# Patient Record
Sex: Female | Born: 1960 | Race: White | Hispanic: No | Smoking: Former smoker
Health system: Southern US, Community
[De-identification: ages and names within clinical notes are randomized; demographics above are authoritative.]

## PROBLEM LIST (undated history)

## (undated) DIAGNOSIS — M329 Systemic lupus erythematosus, unspecified: Secondary | ICD-10-CM

## (undated) DIAGNOSIS — Z8489 Family history of other specified conditions: Secondary | ICD-10-CM

## (undated) DIAGNOSIS — F419 Anxiety disorder, unspecified: Secondary | ICD-10-CM

## (undated) DIAGNOSIS — IMO0002 Reserved for concepts with insufficient information to code with codable children: Secondary | ICD-10-CM

## (undated) DIAGNOSIS — M8580 Other specified disorders of bone density and structure, unspecified site: Secondary | ICD-10-CM

## (undated) DIAGNOSIS — E559 Vitamin D deficiency, unspecified: Secondary | ICD-10-CM

## (undated) DIAGNOSIS — C801 Malignant (primary) neoplasm, unspecified: Secondary | ICD-10-CM

## (undated) DIAGNOSIS — M1990 Unspecified osteoarthritis, unspecified site: Secondary | ICD-10-CM

## (undated) DIAGNOSIS — F32A Depression, unspecified: Secondary | ICD-10-CM

## (undated) HISTORY — PX: NASAL POLYP SURGERY: SHX186

## (undated) HISTORY — DX: Depression, unspecified: F32.A

## (undated) HISTORY — DX: Vitamin D deficiency, unspecified: E55.9

## (undated) HISTORY — PX: HAND SURGERY: SHX662

## (undated) HISTORY — PX: PANCREAS SURGERY: SHX731

## (undated) HISTORY — PX: ABDOMINAL HYSTERECTOMY: SHX81

## (undated) HISTORY — PX: TOTAL ABDOMINAL HYSTERECTOMY W/ BILATERAL SALPINGOOPHORECTOMY: SHX83

## (undated) HISTORY — PX: CHOLECYSTECTOMY: SHX55

## (undated) HISTORY — DX: Other specified disorders of bone density and structure, unspecified site: M85.80

---

## 2003-10-15 DIAGNOSIS — Z9049 Acquired absence of other specified parts of digestive tract: Secondary | ICD-10-CM | POA: Insufficient documentation

## 2006-03-17 ENCOUNTER — Ambulatory Visit: Payer: Self-pay | Admitting: Internal Medicine

## 2007-01-02 ENCOUNTER — Ambulatory Visit: Payer: Self-pay | Admitting: Unknown Physician Specialty

## 2007-08-15 ENCOUNTER — Ambulatory Visit: Payer: Self-pay | Admitting: Unknown Physician Specialty

## 2009-01-16 ENCOUNTER — Ambulatory Visit: Payer: Self-pay | Admitting: Unknown Physician Specialty

## 2009-05-28 ENCOUNTER — Ambulatory Visit: Payer: Self-pay | Admitting: Gynecologic Oncology

## 2009-06-10 ENCOUNTER — Ambulatory Visit: Payer: Self-pay | Admitting: Gynecologic Oncology

## 2009-06-27 ENCOUNTER — Ambulatory Visit: Payer: Self-pay | Admitting: Gynecologic Oncology

## 2009-09-23 ENCOUNTER — Emergency Department: Payer: Self-pay | Admitting: Emergency Medicine

## 2009-12-25 ENCOUNTER — Ambulatory Visit: Payer: Self-pay | Admitting: Otolaryngology

## 2010-09-10 ENCOUNTER — Ambulatory Visit: Payer: Self-pay | Admitting: Unknown Physician Specialty

## 2011-11-19 ENCOUNTER — Ambulatory Visit: Payer: Self-pay | Admitting: Unknown Physician Specialty

## 2013-09-27 DIAGNOSIS — Z932 Ileostomy status: Secondary | ICD-10-CM

## 2013-09-27 HISTORY — DX: Ileostomy status: Z93.2

## 2014-07-23 ENCOUNTER — Other Ambulatory Visit: Payer: Self-pay | Admitting: Physician Assistant

## 2014-07-23 NOTE — H&P (Signed)
TOTAL KNEE ADMISSION H&P  Patient is being admitted for right total knee arthroplasty.  Subjective:  Chief Complaint:right knee pain.  HPI: Hannah Hurst, 53 y.o. female, has a history of pain and functional disability in the right knee due to arthritis and has failed non-surgical conservative treatments for greater than 12 weeks to includeNSAID's and/or analgesics, corticosteriod injections, viscosupplementation injections and activity modification.  Onset of symptoms was gradual, starting >10 years ago with gradually worsening course since that time. The patient noted no past surgery on the right knee(s).  Patient currently rates pain in the right knee(s) at 4 out of 10 with activity. Patient has night pain, worsening of pain with activity and weight bearing, crepitus and joint swelling.  Patient has evidence of subchondral cysts, subchondral sclerosis, periarticular osteophytes and joint space narrowing by imaging studies. There is no active infection.  There are no active problems to display for this patient.  No past medical history on file.  No past surgical history on file.   (Not in a hospital admission) Allergies not on file  History  Substance Use Topics  . Smoking status: Not on file  . Smokeless tobacco: Not on file  . Alcohol Use: Not on file    No family history on file.   Review of Systems  Constitutional: Negative.   HENT: Negative.   Eyes: Negative.   Respiratory: Negative.   Cardiovascular: Negative.   Gastrointestinal: Negative.   Genitourinary: Negative.   Musculoskeletal: Positive for joint pain.  Skin: Negative.   Neurological: Negative.   Endo/Heme/Allergies: Negative.   Psychiatric/Behavioral: Negative.     Objective:  Physical Exam  Constitutional: She is oriented to person, place, and time. She appears well-developed and well-nourished.  HENT:  Head: Normocephalic and atraumatic.  Eyes: EOM are normal. Pupils are equal, round, and reactive to  light.  Neck: Normal range of motion. Neck supple.  Cardiovascular: Normal rate, regular rhythm and normal heart sounds.  Exam reveals no gallop and no friction rub.   No murmur heard. Respiratory: Effort normal and breath sounds normal.  GI: Soft. Bowel sounds are normal.  Musculoskeletal:  On the right she has a 2-3+ effusion.  3+ patellofemoral crepitus.  Her alignment is not bad, but there are marked degenerative changes in the patellofemoral joint.  I am not getting a lot of tenderness medial or lateral compartment.  Her ligaments are otherwise stable.  Neurovascularly intact distally.  Neurological: She is alert and oriented to person, place, and time.  Skin: Skin is warm and dry.  Psychiatric: She has a normal mood and affect. Her behavior is normal. Judgment and thought content normal.    Vital signs in last 24 hours: @VSRANGES @  Labs:   There is no height or weight on file to calculate BMI.   Imaging Review Plain radiographs demonstrate severe degenerative joint disease of the right knee(s). The overall alignment isneutral. The bone quality appears to be fair for age and reported activity level.  Assessment/Plan:  End stage arthritis, right knee   The patient history, physical examination, clinical judgment of the provider and imaging studies are consistent with end stage degenerative joint disease of the right knee(s) and total knee arthroplasty is deemed medically necessary. The treatment options including medical management, injection therapy arthroscopy and arthroplasty were discussed at length. The risks and benefits of total knee arthroplasty were presented and reviewed. The risks due to aseptic loosening, infection, stiffness, patella tracking problems, thromboembolic complications and other imponderables were discussed. The  patient acknowledged the explanation, agreed to proceed with the plan and consent was signed. Patient is being admitted for inpatient treatment for  surgery, pain control, PT, OT, prophylactic antibiotics, VTE prophylaxis, progressive ambulation and ADL's and discharge planning. The patient is planning to be discharged home with home health services

## 2014-07-25 ENCOUNTER — Encounter (HOSPITAL_COMMUNITY): Payer: Self-pay | Admitting: Pharmacy Technician

## 2014-07-29 ENCOUNTER — Other Ambulatory Visit (HOSPITAL_COMMUNITY): Payer: Self-pay | Admitting: *Deleted

## 2014-07-29 NOTE — Pre-Procedure Instructions (Signed)
Hannah Hurst  07/29/2014   Your procedure is scheduled on:  Wednesday, August 07, 2014 at 1:20 PM.   Report to [facility] Entrance "A" Admitting Office at 11:20 AM.   Call this number if you have problems the morning of surgery: [phone]   Remember:   Do not eat food or drink liquids after midnight.   Take these medicines the morning of surgery with A SIP OF WATER:  Tramadol - if needed   Do not wear jewelry, make-up or nail polish.  Do not wear lotions, powders, or perfumes. You may wear deodorant.  Do not shave 48 hours prior to surgery.  Do not bring valuables to the hospital.  [facility] is not responsible                  for any belongings or valuables.               Contacts, dentures or bridgework may not be worn into surgery.  Leave suitcase in the car. After surgery it may be brought to your room.  For patients admitted to the hospital, discharge time is determined by your                treatment team.              Special Instructions: [facility] - Preparing for Surgery  Before surgery, you can play an important role.  Because skin is not sterile, your skin needs to be as free of germs as possible.  You can reduce the number of germs on you skin by washing with CHG (chlorahexidine gluconate) soap before surgery.  CHG is an antiseptic cleaner which kills germs and bonds with the skin to continue killing germs even after washing.  Please DO NOT use if you have an allergy to CHG or antibacterial soaps.  If your skin becomes reddened/irritated stop using the CHG and inform your nurse when you arrive at Short Stay.  Do not shave (including legs and underarms) for at least 48 hours prior to the first CHG shower.  You may shave your face.  Please follow these instructions carefully:   1.  Shower with CHG Soap the night before surgery and the                                morning of Surgery.  2.  If you choose to wash your hair, wash your hair first as usual  with your       normal shampoo.  3.  After you shampoo, rinse your hair and body thoroughly to remove the                      Shampoo.  4.  Use CHG as you would any other liquid soap.  You can apply chg directly       to the skin and wash gently with scrungie or a clean washcloth.  5.  Apply the CHG Soap to your body ONLY FROM THE NECK DOWN.        Do not use on open wounds or open sores.  Avoid contact with your eyes, ears, mouth and genitals (private parts).  Wash genitals (private parts) with your normal soap.  6.  Wash thoroughly, paying special attention to the area where your surgery        will be performed.  7.  Thoroughly rinse  your body with warm water from the neck down.  8.  DO NOT shower/wash with your normal soap after using and rinsing off       the CHG Soap.  9.  Pat yourself dry with a clean towel.            10.  Wear clean pajamas.            11.  Place clean sheets on your bed the night of your first shower and do not        sleep with pets.  Day of Surgery  Do not apply any lotions the morning of surgery.  Please wear clean clothes to the hospital.     Please read over the following fact sheets that you were given: Pain Booklet, Coughing and Deep Breathing, Blood Transfusion Information, MRSA Information and Surgical Site Infection Prevention

## 2014-07-30 ENCOUNTER — Encounter (HOSPITAL_COMMUNITY): Payer: Self-pay

## 2014-07-30 ENCOUNTER — Encounter (HOSPITAL_COMMUNITY)
Admission: RE | Admit: 2014-07-30 | Discharge: 2014-07-30 | Disposition: A | Discharge status: Home / Self Care | Payer: BC Managed Care – PPO | Source: Ambulatory Visit | Attending: Orthopedic Surgery | Admitting: Orthopedic Surgery

## 2014-07-30 DIAGNOSIS — M179 Osteoarthritis of knee, unspecified: Secondary | ICD-10-CM | POA: Insufficient documentation

## 2014-07-30 DIAGNOSIS — I498 Other specified cardiac arrhythmias: Secondary | ICD-10-CM | POA: Diagnosis not present

## 2014-07-30 DIAGNOSIS — Z01818 Encounter for other preprocedural examination: Secondary | ICD-10-CM | POA: Diagnosis present

## 2014-07-30 HISTORY — DX: Anxiety disorder, unspecified: F41.9

## 2014-07-30 LAB — COMPREHENSIVE METABOLIC PANEL
ALK PHOS: 70 U/L (ref 39–117)
ALT: 21 U/L (ref 0–35)
ANION GAP: 16 — AB (ref 5–15)
AST: 27 U/L (ref 0–37)
Albumin: 4.4 g/dL (ref 3.5–5.2)
BILIRUBIN TOTAL: 0.5 mg/dL (ref 0.3–1.2)
BUN: 15 mg/dL (ref 6–23)
CO2: 22 meq/L (ref 19–32)
Calcium: 9.4 mg/dL (ref 8.4–10.5)
Chloride: 101 mEq/L (ref 96–112)
Creatinine, Ser: 0.51 mg/dL (ref 0.50–1.10)
GFR calc Af Amer: >90 mL/min (ref >90)
GFR calc non Af Amer: >90 mL/min (ref >90)
GLUCOSE: 91 mg/dL (ref 70–99)
POTASSIUM: 4 meq/L (ref 3.7–5.3)
SODIUM: 139 meq/L (ref 137–147)
Total Protein: 7 g/dL (ref 6.0–8.3)

## 2014-07-30 LAB — TYPE AND SCREEN
ABO/RH(D): A NEG
Antibody Screen: NEGATIVE

## 2014-07-30 LAB — CBC WITH DIFFERENTIAL/PLATELET
Basophils Absolute: 0 10*3/uL (ref 0.0–0.1)
Basophils Relative: 0 % (ref 0–1)
Eosinophils Absolute: 0.1 10*3/uL (ref 0.0–0.7)
Eosinophils Relative: 2 % (ref 0–5)
HCT: 43.2 % (ref 36.0–46.0)
HEMOGLOBIN: 14.5 g/dL (ref 12.0–15.0)
LYMPHS ABS: 1.6 10*3/uL (ref 0.7–4.0)
Lymphocytes Relative: 21 % (ref 12–46)
MCH: 30.1 pg (ref 26.0–34.0)
MCHC: 33.6 g/dL (ref 30.0–36.0)
MCV: 89.8 fL (ref 78.0–100.0)
MONOS PCT: 7 % (ref 3–12)
Monocytes Absolute: 0.5 10*3/uL (ref 0.1–1.0)
NEUTROS PCT: 70 % (ref 43–77)
Neutro Abs: 5.3 10*3/uL (ref 1.7–7.7)
PLATELETS: 260 10*3/uL (ref 150–400)
RBC: 4.81 MIL/uL (ref 3.87–5.11)
RDW: 13.5 % (ref 11.5–15.5)
WBC: 7.5 10*3/uL (ref 4.0–10.5)

## 2014-07-30 LAB — URINALYSIS, ROUTINE W REFLEX MICROSCOPIC
Bilirubin Urine: NEGATIVE
GLUCOSE, UA: NEGATIVE mg/dL
Hgb urine dipstick: NEGATIVE
Ketones, ur: NEGATIVE mg/dL
LEUKOCYTES UA: NEGATIVE
Nitrite: NEGATIVE
PH: 7 (ref 5.0–8.0)
PROTEIN: NEGATIVE mg/dL
SPECIFIC GRAVITY, URINE: 1.023 (ref 1.005–1.030)
Urobilinogen, UA: 0.2 mg/dL (ref 0.0–1.0)

## 2014-07-30 LAB — PROTIME-INR
INR: 0.94 (ref 0.00–1.49)
Prothrombin Time: 12.7 seconds (ref 11.6–15.2)

## 2014-07-30 LAB — APTT: aPTT: 30 seconds (ref 24–37)

## 2014-07-30 LAB — ABO/RH: ABO/RH(D): A NEG

## 2014-07-30 LAB — SURGICAL PCR SCREEN
MRSA, PCR: NEGATIVE
STAPHYLOCOCCUS AUREUS: POSITIVE — AB

## 2014-07-30 MED ORDER — CHLORHEXIDINE GLUCONATE 4 % EX LIQD: 60.0000 mL | Freq: Once | CUTANEOUS | Status: DC

## 2014-07-30 MED ORDER — LACTATED RINGERS IV SOLN
INTRAVENOUS | Status: DC
Start: 2014-07-30 — End: 2014-07-31

## 2014-08-06 MED ORDER — CEFAZOLIN SODIUM-DEXTROSE 2-3 GM-% IV SOLR
2.0000 g | INTRAVENOUS | Status: AC
  Administered 2014-08-07: 2 g via INTRAVENOUS
  Filled 2014-08-06: qty 50

## 2014-08-06 NOTE — Progress Notes (Signed)
Left message for patient to arrive at 0930

## 2014-08-07 ENCOUNTER — Inpatient Hospital Stay (HOSPITAL_COMMUNITY): Payer: BC Managed Care – PPO | Admitting: Anesthesiology

## 2014-08-07 ENCOUNTER — Inpatient Hospital Stay (HOSPITAL_COMMUNITY): Payer: BC Managed Care – PPO

## 2014-08-07 ENCOUNTER — Encounter (HOSPITAL_COMMUNITY): Payer: Self-pay | Admitting: Surgery

## 2014-08-07 ENCOUNTER — Encounter (HOSPITAL_COMMUNITY)
Admission: RE | Disposition: A | Discharge status: Home Health | Payer: Self-pay | Source: Ambulatory Visit | Attending: Orthopedic Surgery

## 2014-08-07 ENCOUNTER — Inpatient Hospital Stay (HOSPITAL_COMMUNITY)
Admission: RE | Admit: 2014-08-07 | Discharge: 2014-08-09 | DRG: 470 | Disposition: A | Discharge status: Home Health | Payer: BC Managed Care – PPO | Source: Ambulatory Visit | Attending: Orthopedic Surgery | Admitting: Orthopedic Surgery

## 2014-08-07 DIAGNOSIS — G8918 Other acute postprocedural pain: Secondary | ICD-10-CM | POA: Diagnosis not present

## 2014-08-07 DIAGNOSIS — Z96651 Presence of right artificial knee joint: Secondary | ICD-10-CM

## 2014-08-07 DIAGNOSIS — D62 Acute posthemorrhagic anemia: Secondary | ICD-10-CM | POA: Diagnosis not present

## 2014-08-07 DIAGNOSIS — M179 Osteoarthritis of knee, unspecified: Secondary | ICD-10-CM | POA: Diagnosis present

## 2014-08-07 DIAGNOSIS — Z79899 Other long term (current) drug therapy: Secondary | ICD-10-CM

## 2014-08-07 DIAGNOSIS — F17210 Nicotine dependence, cigarettes, uncomplicated: Secondary | ICD-10-CM | POA: Diagnosis present

## 2014-08-07 DIAGNOSIS — F419 Anxiety disorder, unspecified: Secondary | ICD-10-CM | POA: Diagnosis present

## 2014-08-07 DIAGNOSIS — M25561 Pain in right knee: Secondary | ICD-10-CM | POA: Diagnosis present

## 2014-08-07 DIAGNOSIS — F909 Attention-deficit hyperactivity disorder, unspecified type: Secondary | ICD-10-CM | POA: Diagnosis present

## 2014-08-07 DIAGNOSIS — Z96659 Presence of unspecified artificial knee joint: Secondary | ICD-10-CM

## 2014-08-07 DIAGNOSIS — Z7982 Long term (current) use of aspirin: Secondary | ICD-10-CM | POA: Diagnosis not present

## 2014-08-07 DIAGNOSIS — M150 Primary generalized (osteo)arthritis: Secondary | ICD-10-CM | POA: Diagnosis present

## 2014-08-07 DIAGNOSIS — M1710 Unilateral primary osteoarthritis, unspecified knee: Secondary | ICD-10-CM | POA: Diagnosis present

## 2014-08-07 HISTORY — PX: TOTAL KNEE ARTHROPLASTY: SHX125

## 2014-08-07 HISTORY — DX: Family history of other specified conditions: Z84.89

## 2014-08-07 HISTORY — DX: Unspecified osteoarthritis, unspecified site: M19.90

## 2014-08-07 SURGERY — ARTHROPLASTY, KNEE, TOTAL
Anesthesia: Monitor Anesthesia Care | Site: Knee | Laterality: Right

## 2014-08-07 MED ORDER — DIPHENHYDRAMINE HCL 12.5 MG/5ML PO ELIX: 12.5000 mg | ORAL_SOLUTION | ORAL | Status: DC | PRN

## 2014-08-07 MED ORDER — ASPIRIN EC 325 MG PO TBEC
325.0000 mg | DELAYED_RELEASE_TABLET | Freq: Every day | ORAL | Status: DC
  Administered 2014-08-08 – 2014-08-09 (×2): 325 mg via ORAL
  Filled 2014-08-07 (×3): qty 1

## 2014-08-07 MED ORDER — PROPOFOL 10 MG/ML IV BOLUS
INTRAVENOUS | Status: AC
  Filled 2014-08-07: qty 20

## 2014-08-07 MED ORDER — ACETAMINOPHEN 160 MG/5ML PO SOLN: 325.0000 mg | ORAL | Status: DC | PRN

## 2014-08-07 MED ORDER — LACTATED RINGERS IV SOLN
INTRAVENOUS | Status: DC
  Administered 2014-08-07 (×2): via INTRAVENOUS

## 2014-08-07 MED ORDER — WHITE PETROLATUM GEL
Status: AC
  Administered 2014-08-07: 20:00:00
  Filled 2014-08-07: qty 5

## 2014-08-07 MED ORDER — MIDAZOLAM HCL 5 MG/5ML IJ SOLN
INTRAMUSCULAR | Status: DC | PRN
  Administered 2014-08-07: 2 mg via INTRAVENOUS

## 2014-08-07 MED ORDER — METHOCARBAMOL 500 MG PO TABS
500.0000 mg | ORAL_TABLET | Freq: Four times a day (QID) | ORAL | Status: DC | PRN
  Administered 2014-08-07 – 2014-08-09 (×8): 500 mg via ORAL
  Filled 2014-08-07 (×7): qty 1

## 2014-08-07 MED ORDER — OXYCODONE-ACETAMINOPHEN 5-325 MG PO TABS: 1.0000 | ORAL_TABLET | ORAL | Status: DC | PRN

## 2014-08-07 MED ORDER — GLYCOPYRROLATE 0.2 MG/ML IJ SOLN
INTRAMUSCULAR | Status: AC
  Filled 2014-08-07: qty 3

## 2014-08-07 MED ORDER — BUPIVACAINE IN DEXTROSE 0.75-8.25 % IT SOLN
INTRATHECAL | Status: DC | PRN
  Administered 2014-08-07: 1.8 mL via INTRATHECAL

## 2014-08-07 MED ORDER — OXYCODONE HCL 5 MG PO TABS
5.0000 mg | ORAL_TABLET | Freq: Once | ORAL | Status: AC | PRN
  Administered 2014-08-07: 5 mg via ORAL

## 2014-08-07 MED ORDER — ALUM & MAG HYDROXIDE-SIMETH 200-200-20 MG/5ML PO SUSP: 30.0000 mL | ORAL | Status: DC | PRN

## 2014-08-07 MED ORDER — PHENYLEPHRINE HCL 10 MG/ML IJ SOLN
INTRAMUSCULAR | Status: DC | PRN
  Administered 2014-08-07 (×3): 80 ug via INTRAVENOUS

## 2014-08-07 MED ORDER — BUPIVACAINE HCL (PF) 0.25 % IJ SOLN
INTRAMUSCULAR | Status: AC
  Filled 2014-08-07: qty 30

## 2014-08-07 MED ORDER — BUPIVACAINE HCL (PF) 0.5 % IJ SOLN
INTRAMUSCULAR | Status: AC
  Filled 2014-08-07: qty 10

## 2014-08-07 MED ORDER — HYDROMORPHONE HCL 1 MG/ML IJ SOLN
0.2500 mg | INTRAMUSCULAR | Status: DC | PRN
  Administered 2014-08-07 (×4): 0.5 mg via INTRAVENOUS

## 2014-08-07 MED ORDER — BISACODYL 5 MG PO TBEC: 5.0000 mg | DELAYED_RELEASE_TABLET | Freq: Every day | ORAL | Status: DC | PRN

## 2014-08-07 MED ORDER — ACETAMINOPHEN 650 MG RE SUPP: 650.0000 mg | Freq: Four times a day (QID) | RECTAL | Status: DC | PRN

## 2014-08-07 MED ORDER — MENTHOL 3 MG MT LOZG: 1.0000 | LOZENGE | OROMUCOSAL | Status: DC | PRN

## 2014-08-07 MED ORDER — DEXTROSE 5 % IV SOLN
500.0000 mg | Freq: Four times a day (QID) | INTRAVENOUS | Status: DC | PRN
  Filled 2014-08-07: qty 5

## 2014-08-07 MED ORDER — BUPIVACAINE LIPOSOME 1.3 % IJ SUSP
20.0000 mL | Freq: Once | INTRAMUSCULAR | Status: AC
  Administered 2014-08-07: 20 mL
  Filled 2014-08-07: qty 20

## 2014-08-07 MED ORDER — DOCUSATE SODIUM 100 MG PO CAPS
100.0000 mg | ORAL_CAPSULE | Freq: Two times a day (BID) | ORAL | Status: DC
  Administered 2014-08-07 – 2014-08-09 (×4): 100 mg via ORAL
  Filled 2014-08-07 (×4): qty 1

## 2014-08-07 MED ORDER — MIDAZOLAM HCL 2 MG/2ML IJ SOLN
INTRAMUSCULAR | Status: AC
  Filled 2014-08-07: qty 2

## 2014-08-07 MED ORDER — NICOTINE 7 MG/24HR TD PT24
7.0000 mg | MEDICATED_PATCH | Freq: Every day | TRANSDERMAL | Status: DC
  Administered 2014-08-07 – 2014-08-09 (×3): 7 mg via TRANSDERMAL
  Filled 2014-08-07 (×3): qty 1

## 2014-08-07 MED ORDER — POTASSIUM CHLORIDE IN NACL 20-0.9 MEQ/L-% IV SOLN
INTRAVENOUS | Status: DC
  Administered 2014-08-07: 22:00:00 via INTRAVENOUS
  Filled 2014-08-07 (×3): qty 1000

## 2014-08-07 MED ORDER — OXYCODONE HCL 5 MG PO TABS
5.0000 mg | ORAL_TABLET | ORAL | Status: DC | PRN
  Administered 2014-08-07 (×2): 10 mg via ORAL
  Administered 2014-08-07 (×2): 5 mg via ORAL
  Administered 2014-08-08 – 2014-08-09 (×12): 10 mg via ORAL
  Filled 2014-08-07 (×11): qty 2
  Filled 2014-08-07: qty 1
  Filled 2014-08-07 (×3): qty 2

## 2014-08-07 MED ORDER — METOCLOPRAMIDE HCL 5 MG PO TABS
5.0000 mg | ORAL_TABLET | Freq: Three times a day (TID) | ORAL | Status: DC | PRN
  Filled 2014-08-07: qty 2

## 2014-08-07 MED ORDER — ONDANSETRON HCL 4 MG/2ML IJ SOLN
INTRAMUSCULAR | Status: DC | PRN
  Administered 2014-08-07: 4 mg via INTRAVENOUS

## 2014-08-07 MED ORDER — ACETAMINOPHEN 325 MG PO TABS: 325.0000 mg | ORAL_TABLET | ORAL | Status: DC | PRN

## 2014-08-07 MED ORDER — DEXAMETHASONE SODIUM PHOSPHATE 10 MG/ML IJ SOLN
INTRAMUSCULAR | Status: AC
  Filled 2014-08-07: qty 1

## 2014-08-07 MED ORDER — PHENOL 1.4 % MT LIQD
1.0000 | OROMUCOSAL | Status: DC | PRN
Start: 2014-08-07 — End: 2014-08-09

## 2014-08-07 MED ORDER — FENTANYL CITRATE 0.05 MG/ML IJ SOLN
INTRAMUSCULAR | Status: DC | PRN
  Administered 2014-08-07 (×5): 50 ug via INTRAVENOUS

## 2014-08-07 MED ORDER — OXYCODONE HCL 5 MG/5ML PO SOLN: 5.0000 mg | Freq: Once | ORAL | Status: AC | PRN

## 2014-08-07 MED ORDER — METHOCARBAMOL 500 MG PO TABS
ORAL_TABLET | ORAL | Status: AC
  Administered 2014-08-07: 500 mg via ORAL
  Filled 2014-08-07: qty 1

## 2014-08-07 MED ORDER — ACETAMINOPHEN 325 MG PO TABS: 650.0000 mg | ORAL_TABLET | Freq: Four times a day (QID) | ORAL | Status: DC | PRN

## 2014-08-07 MED ORDER — ONDANSETRON HCL 4 MG/2ML IJ SOLN
INTRAMUSCULAR | Status: AC
  Filled 2014-08-07: qty 2

## 2014-08-07 MED ORDER — PROPOFOL 10 MG/ML IV BOLUS
INTRAVENOUS | Status: DC | PRN
  Administered 2014-08-07: 2 mg via INTRAVENOUS
  Administered 2014-08-07: 20 mg via INTRAVENOUS

## 2014-08-07 MED ORDER — FENTANYL CITRATE 0.05 MG/ML IJ SOLN
INTRAMUSCULAR | Status: AC
  Filled 2014-08-07: qty 5

## 2014-08-07 MED ORDER — ONDANSETRON HCL 4 MG/2ML IJ SOLN: 4.0000 mg | Freq: Four times a day (QID) | INTRAMUSCULAR | Status: DC | PRN

## 2014-08-07 MED ORDER — LIDOCAINE HCL (CARDIAC) 20 MG/ML IV SOLN
INTRAVENOUS | Status: AC
  Filled 2014-08-07: qty 5

## 2014-08-07 MED ORDER — ONDANSETRON HCL 4 MG PO TABS: 4.0000 mg | ORAL_TABLET | Freq: Three times a day (TID) | ORAL | Status: DC | PRN

## 2014-08-07 MED ORDER — CELECOXIB 200 MG PO CAPS
200.0000 mg | ORAL_CAPSULE | Freq: Two times a day (BID) | ORAL | Status: DC
  Administered 2014-08-07 – 2014-08-09 (×5): 200 mg via ORAL
  Filled 2014-08-07 (×6): qty 1

## 2014-08-07 MED ORDER — ONDANSETRON HCL 4 MG PO TABS: 4.0000 mg | ORAL_TABLET | Freq: Four times a day (QID) | ORAL | Status: DC | PRN

## 2014-08-07 MED ORDER — METHOCARBAMOL 500 MG PO TABS: 500.0000 mg | ORAL_TABLET | Freq: Four times a day (QID) | ORAL | Status: DC

## 2014-08-07 MED ORDER — PROPOFOL INFUSION 10 MG/ML OPTIME
INTRAVENOUS | Status: DC | PRN
  Administered 2014-08-07: 20 ug/kg/min via INTRAVENOUS

## 2014-08-07 MED ORDER — HYDROMORPHONE HCL 1 MG/ML IJ SOLN
0.5000 mg | INTRAMUSCULAR | Status: DC | PRN
  Administered 2014-08-07 – 2014-08-08 (×6): 1 mg via INTRAVENOUS
  Filled 2014-08-07 (×6): qty 1

## 2014-08-07 MED ORDER — OXYCODONE HCL 5 MG PO TABS
ORAL_TABLET | ORAL | Status: AC
  Filled 2014-08-07: qty 2

## 2014-08-07 MED ORDER — NEOSTIGMINE METHYLSULFATE 10 MG/10ML IV SOLN
INTRAVENOUS | Status: AC
  Filled 2014-08-07: qty 1

## 2014-08-07 MED ORDER — SODIUM CHLORIDE 0.9 % IJ SOLN
INTRAMUSCULAR | Status: DC | PRN
  Administered 2014-08-07: 40 mL

## 2014-08-07 MED ORDER — BUPIVACAINE HCL (PF) 0.25 % IJ SOLN
INTRAMUSCULAR | Status: DC | PRN
  Administered 2014-08-07: 10 mL

## 2014-08-07 MED ORDER — ASPIRIN EC 325 MG PO TBEC: 325.0000 mg | DELAYED_RELEASE_TABLET | Freq: Every day | ORAL | Status: DC

## 2014-08-07 MED ORDER — KETOROLAC TROMETHAMINE 30 MG/ML IJ SOLN
INTRAMUSCULAR | Status: AC
  Filled 2014-08-07: qty 1

## 2014-08-07 MED ORDER — HYDROMORPHONE HCL 1 MG/ML IJ SOLN
INTRAMUSCULAR | Status: AC
  Administered 2014-08-07: 0.5 mg via INTRAVENOUS
  Filled 2014-08-07: qty 2

## 2014-08-07 MED ORDER — CEFAZOLIN SODIUM 1-5 GM-% IV SOLN
1.0000 g | Freq: Four times a day (QID) | INTRAVENOUS | Status: AC
  Administered 2014-08-07 (×2): 1 g via INTRAVENOUS
  Filled 2014-08-07 (×2): qty 50

## 2014-08-07 MED ORDER — METHYLPREDNISOLONE ACETATE 80 MG/ML IJ SUSP
INTRAMUSCULAR | Status: AC
  Filled 2014-08-07: qty 1

## 2014-08-07 MED ORDER — SODIUM CHLORIDE 0.9 % IR SOLN
Status: DC | PRN
Start: 2014-08-07 — End: 2014-08-07
  Administered 2014-08-07: 3000 mL

## 2014-08-07 MED ORDER — METOCLOPRAMIDE HCL 5 MG/ML IJ SOLN: 5.0000 mg | Freq: Three times a day (TID) | INTRAMUSCULAR | Status: DC | PRN

## 2014-08-07 SURGICAL SUPPLY — 65 items
BANDAGE ELASTIC 4 VELCRO ST LF (GAUZE/BANDAGES/DRESSINGS) ×3 IMPLANT
BANDAGE ELASTIC 6 VELCRO ST LF (GAUZE/BANDAGES/DRESSINGS) ×3 IMPLANT
BANDAGE ESMARK 6X9 LF (GAUZE/BANDAGES/DRESSINGS) ×1 IMPLANT
BENZOIN TINCTURE PRP APPL 2/3 (GAUZE/BANDAGES/DRESSINGS) ×3 IMPLANT
BLADE SAG 18X100X1.27 (BLADE) ×6 IMPLANT
BNDG ESMARK 6X9 LF (GAUZE/BANDAGES/DRESSINGS) ×3
BOWL SMART MIX CTS (DISPOSABLE) ×3 IMPLANT
CEMENT BONE SIMPLEX SPEEDSET (Cement) ×6 IMPLANT
CLOSURE STERI-STRIP 1/2X4 (GAUZE/BANDAGES/DRESSINGS) ×1
CLOSURE WOUND 1/2 X4 (GAUZE/BANDAGES/DRESSINGS) ×2
CLSR STERI-STRIP ANTIMIC 1/2X4 (GAUZE/BANDAGES/DRESSINGS) ×2 IMPLANT
COVER SURGICAL LIGHT HANDLE (MISCELLANEOUS) ×3 IMPLANT
CUFF TOURNIQUET SINGLE 34IN LL (TOURNIQUET CUFF) ×3 IMPLANT
DRAPE EXTREMITY T 121X128X90 (DRAPE) ×3 IMPLANT
DRAPE IMP U-DRAPE 54X76 (DRAPES) ×3 IMPLANT
DRAPE PROXIMA HALF (DRAPES) ×3 IMPLANT
DRAPE U-SHAPE 47X51 STRL (DRAPES) ×3 IMPLANT
DRSG PAD ABDOMINAL 8X10 ST (GAUZE/BANDAGES/DRESSINGS) ×6 IMPLANT
DURAPREP 26ML APPLICATOR (WOUND CARE) ×6 IMPLANT
ELECT CAUTERY BLADE 6.4 (BLADE) ×3 IMPLANT
ELECT REM PT RETURN 9FT ADLT (ELECTROSURGICAL) ×3
ELECTRODE REM PT RTRN 9FT ADLT (ELECTROSURGICAL) ×1 IMPLANT
EVACUATOR 1/8 PVC DRAIN (DRAIN) ×3 IMPLANT
FACESHIELD WRAPAROUND (MASK) ×6 IMPLANT
GAUZE SPONGE 4X4 12PLY STRL (GAUZE/BANDAGES/DRESSINGS) ×3 IMPLANT
GLOVE BIOGEL PI IND STRL 7.0 (GLOVE) ×2 IMPLANT
GLOVE BIOGEL PI INDICATOR 7.0 (GLOVE) ×4
GLOVE ECLIPSE 6.5 STRL STRAW (GLOVE) ×6 IMPLANT
GLOVE ORTHO TXT STRL SZ7.5 (GLOVE) ×3 IMPLANT
GOWN STRL REUS W/ TWL LRG LVL3 (GOWN DISPOSABLE) ×2 IMPLANT
GOWN STRL REUS W/ TWL XL LVL3 (GOWN DISPOSABLE) ×2 IMPLANT
GOWN STRL REUS W/TWL LRG LVL3 (GOWN DISPOSABLE) ×4
GOWN STRL REUS W/TWL XL LVL3 (GOWN DISPOSABLE) ×4
HANDPIECE INTERPULSE COAX TIP (DISPOSABLE) ×2
IMMOBILIZER KNEE 22 UNIV (SOFTGOODS) ×3 IMPLANT
IMMOBILIZER KNEE 24 THIGH 36 (MISCELLANEOUS) IMPLANT
IMMOBILIZER KNEE 24 UNIV (MISCELLANEOUS)
KIT BASIN OR (CUSTOM PROCEDURE TRAY) ×3 IMPLANT
KIT ROOM TURNOVER OR (KITS) ×3 IMPLANT
KNEE/VIT E POLY LINER LEVEL 1B ×3 IMPLANT
MANIFOLD NEPTUNE II (INSTRUMENTS) ×3 IMPLANT
NEEDLE 18GX1X1/2 (RX/OR ONLY) (NEEDLE) ×3 IMPLANT
NEEDLE HYPO 25GX1X1/2 BEV (NEEDLE) ×3 IMPLANT
NS IRRIG 1000ML POUR BTL (IV SOLUTION) ×3 IMPLANT
PACK TOTAL JOINT (CUSTOM PROCEDURE TRAY) ×3 IMPLANT
PACK UNIVERSAL I (CUSTOM PROCEDURE TRAY) ×3 IMPLANT
PAD ARMBOARD 7.5X6 YLW CONV (MISCELLANEOUS) ×6 IMPLANT
PAD CAST 4YDX4 CTTN HI CHSV (CAST SUPPLIES) ×1 IMPLANT
PADDING CAST COTTON 4X4 STRL (CAST SUPPLIES) ×2
PADDING CAST COTTON 6X4 STRL (CAST SUPPLIES) ×3 IMPLANT
SET HNDPC FAN SPRY TIP SCT (DISPOSABLE) ×1 IMPLANT
STRIP CLOSURE SKIN 1/2X4 (GAUZE/BANDAGES/DRESSINGS) ×4 IMPLANT
SUCTION FRAZIER TIP 10 FR DISP (SUCTIONS) ×3 IMPLANT
SUT MNCRL AB 4-0 PS2 18 (SUTURE) ×3 IMPLANT
SUT VIC AB 0 CT1 27 (SUTURE) ×4
SUT VIC AB 0 CT1 27XBRD ANBCTR (SUTURE) ×2 IMPLANT
SUT VIC AB 1 CT1 27 (SUTURE) ×4
SUT VIC AB 1 CT1 27XBRD ANBCTR (SUTURE) ×2 IMPLANT
SUT VIC AB 2-0 CT1 27 (SUTURE) ×4
SUT VIC AB 2-0 CT1 TAPERPNT 27 (SUTURE) ×2 IMPLANT
SYR 50ML LL SCALE MARK (SYRINGE) ×3 IMPLANT
SYR CONTROL 10ML LL (SYRINGE) ×3 IMPLANT
TOWEL OR 17X24 6PK STRL BLUE (TOWEL DISPOSABLE) ×3 IMPLANT
TOWEL OR 17X26 10 PK STRL BLUE (TOWEL DISPOSABLE) ×3 IMPLANT
WATER STERILE IRR 1000ML POUR (IV SOLUTION) IMPLANT

## 2014-08-07 NOTE — Progress Notes (Signed)
Utilization review completed.  

## 2014-08-07 NOTE — Plan of Care (Signed)
Problem: Consults Goal: Diagnosis- Total Joint Replacement Primary Total Knee RIght  Problem: Phase I Progression Outcomes Goal: CMS/Neurovascular status WDL Outcome: Completed/Met Date Met:  08/07/14 Goal: Hemodynamically stable Outcome: Completed/Met Date Met:  08/07/14

## 2014-08-07 NOTE — Interval H&P Note (Signed)
History and Physical Interval Note:  08/07/2014 8:28 AM  Hannah Hurst  has presented today for surgery, with the diagnosis of djd right knee  The various methods of treatment have been discussed with the patient and family. After consideration of risks, benefits and other options for treatment, the patient has consented to  Procedure(s): RIGHT TOTAL KNEE ARTHROPLASTY (Right) as a surgical intervention .  The patient's history has been reviewed, patient examined, no change in status, stable for surgery.  I have reviewed the patient's chart and labs.  Questions were answered to the patient's satisfaction.     MURPHY,DANIEL F

## 2014-08-07 NOTE — Anesthesia Preprocedure Evaluation (Addendum)
Anesthesia Evaluation  Patient identified by MRN, date of birth, ID band Patient awake    Reviewed: Allergy & Precautions, H&P , NPO status , Patient's Chart, lab work & pertinent test results  History of Anesthesia Complications Negative for: history of anesthetic complications  Airway Mallampati: II  TM Distance: >3 FB Neck ROM: Full    Dental   Pulmonary neg sleep apnea, neg COPDneg recent URI, Current Smoker,  breath sounds clear to auscultation        Cardiovascular negative cardio ROS  Rhythm:Regular     Neuro/Psych adhdnegative neurological ROS     GI/Hepatic negative GI ROS, Neg liver ROS,   Endo/Other  negative endocrine ROS  Renal/GU negative Renal ROS     Musculoskeletal   Abdominal   Peds  Hematology negative hematology ROS (+)   Anesthesia Other Findings   Reproductive/Obstetrics                            Anesthesia Physical Anesthesia Plan  ASA: II  Anesthesia Plan: MAC and Spinal   Post-op Pain Management:    Induction: Intravenous  Airway Management Planned: Simple Face Mask and Natural Airway  Additional Equipment: None  Intra-op Plan:   Post-operative Plan:   Informed Consent: I have reviewed the patients History and Physical, chart, labs and discussed the procedure including the risks, benefits and alternatives for the proposed anesthesia with the patient or authorized representative who has indicated his/her understanding and acceptance.   Dental advisory given  Plan Discussed with: CRNA and Surgeon  Anesthesia Plan Comments:        Anesthesia Quick Evaluation

## 2014-08-07 NOTE — Progress Notes (Signed)
Orthopedic Tech Progress Note Patient Details:  Hannah Hurst 01-06-1961 [id number]  CPM Right Knee CPM Right Knee: On Right Knee Flexion (Degrees): 90 Right Knee Extension (Degrees): 0 Additional Comments: trapeze bar patient helper Viewed order from doctor's order list  [name] 08/07/2014, 2:09 PM

## 2014-08-07 NOTE — Progress Notes (Signed)
Providing lunch break for PACU RN. Patient awake and alert. Pain 7 out of 10 and patient received pain medication. Continue to monitor and assess.

## 2014-08-07 NOTE — Anesthesia Procedure Notes (Signed)
Spinal Patient location during procedure: OR Staffing Anesthesiologist: Jabir Dahlem, CHRIS Preanesthetic Checklist Completed: patient identified, surgical consent, pre-op evaluation, timeout performed, IV checked, risks and benefits discussed and monitors and equipment checked Spinal Block Patient position: sitting Prep: site prepped and draped and DuraPrep Patient monitoring: heart rate, cardiac monitor, continuous pulse ox and blood pressure Approach: midline Location: L4-5 Injection technique: single-shot Needle Needle type: Pencan  Needle gauge: 24 G Needle length: 10 cm Assessment Sensory level: T6   

## 2014-08-07 NOTE — Transfer of Care (Signed)
Immediate Anesthesia Transfer of Care Note  Patient: Hannah Hurst  Procedure(s) Performed: Procedure(s): RIGHT TOTAL KNEE ARTHROPLASTY (Right)  Patient Location: PACU  Anesthesia Type:MAC and Spinal  Level of Consciousness: awake, alert  and oriented  Airway & Oxygen Therapy: Patient Spontanous Breathing  Post-op Assessment: Report given to PACU RN and Post -op Vital signs reviewed and stable  Post vital signs: Reviewed and stable  Complications: No apparent anesthesia complications

## 2014-08-07 NOTE — Discharge Summary (Addendum)
Patient ID: Hannah Hurst MRN: [id number] DOB/AGE: 1960-10-27 53 y.o.  Admit date: 08/07/2014 Discharge date: 08/09/2014  Admission Diagnoses:  Active Problems:   DJD (degenerative joint disease) of knee   Discharge Diagnoses:  Same  Past Medical History  Diagnosis Date  . Anxiety     adhd  . Family history of adverse reaction to anesthesia     MOTHER HAS NAUSEA  . Arthritis     PRIMARY GENERAL BILATERAL KNEES    Surgeries: Procedure(s): RIGHT TOTAL KNEE ARTHROPLASTY on 08/07/2014   Consultants:    Discharged Condition: Improved  Hospital Course: Hannah Hurst is an 53 y.o. female who was admitted 08/07/2014 for operative treatment of primary osteoarthritis right knee. Patient has severe unremitting pain that affects sleep, daily activities, and work/hobbies. After pre-op clearance the patient was taken to the operating room on 08/07/2014 and underwent  Procedure(s): RIGHT TOTAL KNEE ARTHROPLASTY.  Patient with pre-op hb of 14.5 developed ABLA on POD #1 with a hb of 11.0. POD#2 hb of 10.5.  She is currently asymptomatic but we will continue to follow.  Patient was given perioperative antibiotics:      Anti-infectives    Start     Dose/Rate Route Frequency Ordered Stop   08/07/14 1530  ceFAZolin (ANCEF) IVPB 1 g/50 mL premix     1 g100 mL/hr over 30 Minutes Intravenous Every 6 hours 08/07/14 1515 08/07/14 2234   08/07/14 0600  ceFAZolin (ANCEF) IVPB 2 g/50 mL premix     2 g100 mL/hr over 30 Minutes Intravenous On call to O.R. 08/06/14 1334 08/07/14 1151       Patient was given sequential compression devices, early ambulation, and chemoprophylaxis to prevent DVT.  Patient benefited maximally from hospital stay and there were no complications.    Recent vital signs:  Patient Vitals for the past 24 hrs:  BP Temp Pulse Resp SpO2  08/09/14 0544 110/68 mmHg 98.4 F (36.9 C) 71 16 99 %  08/08/14 2109 124/79 mmHg 97.5 F (36.4 C) (!) 57 16 100 %  08/08/14 1300 (!) 145/83  mmHg 97 F (36.1 C) 71 16 100 %     Recent laboratory studies:   Recent Labs  08/08/14 0512 08/09/14 0450  WBC 12.9* 8.0  HGB 11.0* 10.5*  HCT 32.7* 31.2*  PLT 229 187  NA 140 141  K 4.2 3.9  CL 104 105  CO2 25 22  BUN 7 5*  CREATININE 0.52 0.53  GLUCOSE 167* 96  CALCIUM 8.8 8.7     Discharge Medications:     Medication List    STOP taking these medications        traMADol 50 MG tablet  Commonly known as:  ULTRAM      TAKE these medications        aspirin EC 325 MG tablet  Take 1 tablet (325 mg total) by mouth daily.     bisacodyl 5 MG EC tablet  Commonly known as:  DULCOLAX  Take 1 tablet (5 mg total) by mouth daily as needed for moderate constipation.     methocarbamol 500 MG tablet  Commonly known as:  ROBAXIN  Take 1 tablet (500 mg total) by mouth 4 (four) times daily.     methylphenidate 54 MG CR tablet  Commonly known as:  CONCERTA  Take 54 mg by mouth every morning.     ondansetron 4 MG tablet  Commonly known as:  ZOFRAN  Take 1 tablet (4 mg total) by mouth  every 8 (eight) hours as needed for nausea or vomiting.     OxyCODONE 20 mg T12a 12 hr tablet  Commonly known as:  OXYCONTIN  Take 1 tablet (20 mg total) by mouth now.     oxyCODONE-acetaminophen 5-325 MG per tablet  Commonly known as:  ROXICET  Take 1-2 tablets by mouth every 4 (four) hours as needed.        Diagnostic Studies: Dg Knee Right Port  08/07/2014   CLINICAL DATA:  Status post right knee replacement  EXAM: PORTABLE RIGHT KNEE - 1-2 VIEW  COMPARISON:  None.  FINDINGS: A right knee replacement is now seen. No acute bony abnormality is noted. A surgical drain is seen in place. Air is noted in the surgical bed.  IMPRESSION: Status post knee replacement without acute abnormality.   Electronically Signed   By: Inez Catalina M.D.   On: 08/07/2014 14:12    Disposition: Final discharge disposition not confirmed  Discharge Instructions    CPM    Complete by:  As directed    Continuous passive motion machine (CPM):      Use the CPM from 0- to 60 for 6 hours per day.      You may increase by 10 per day.  You may break it up into 2 or 3 sessions per day.      Use CPM for 2-3 weeks or until you are told to stop.     Call MD / Call 911    Complete by:  As directed   If you experience chest pain or shortness of breath, CALL 911 and be transported to the hospital emergency room.  If you develope a fever above 101 F, pus (white drainage) or increased drainage or redness at the wound, or calf pain, call your surgeon's office.     Change dressing    Complete by:  As directed   Change dressing on Saturday, then change the dressing daily with sterile 4 x 4 inch gauze dressing and apply TED hose.  You may clean the incision with alcohol prior to redressing.     Constipation Prevention    Complete by:  As directed   Drink plenty of fluids.  Prune juice may be helpful.  You may use a stool softener, such as Colace (over the counter) 100 mg twice a day.  Use MiraLax (over the counter) for constipation as needed.     Diet - low sodium heart healthy    Complete by:  As directed      Discharge instructions    Complete by:  As directed   Weight bearing as tolerated.  Take Aspirin 325 mg 1 tab a day for the next 30 days to prevent blood clots.  Change dressing daily starting on Saturday.  May shower on Monday, but do not soak incision.  May apply ice for up to 20 minutes at a time for pain and swelling.  Follow up appointment in two weeks.     Do not put a pillow under the knee. Place it under the heel.    Complete by:  As directed   Place gray foam under operative heel when in bed or in a chair to work on extension     Increase activity slowly as tolerated    Complete by:  As directed      TED hose    Complete by:  As directed   Use stockings (TED hose) for 2 weeks on both leg(s).  You  may remove them at night for sleeping.           Follow-up Information    Follow up with  [facility] F, MD. Schedule an appointment as soon as possible for a visit in 2 weeks.   Specialty:  Orthopedic Surgery   Contact information:   [place] [address] [phone]       Follow up with [facility].   Why:  Someone from [facility] will contact you concerning start date and time for physical therapy.   Contact information:   [address] [place] [address] [phone]        Signed: Larae Grooms 08/09/2014, 8:26 AM

## 2014-08-07 NOTE — Discharge Instructions (Signed)
Total Knee Replacement, Care After Refer to this sheet in the next few weeks. These instructions provide you with information on caring for yourself after your procedure. Your health care provider also may give you specific instructions. Your treatment has been planned according to the most current medical practices, but problems sometimes occur. Call your health care provider if you have any problems or questions after your procedure. HOME CARE INSTRUCTIONS   Weight bearing as tolerated.  Take Aspirin 325 mg 1 tab a day for the next 30 days to prevent blood clots.  Change dressing daily starting on Saturday.  May take a shower on Monday, but do not soak incision.  May apply ice for up to 20 minutes at a time for pain and swelling.  Follow up appointment in two weeks.   See a physical therapist as directed by your health care provider.  Take medicines only as directed by your health care provider.  Avoid lifting or driving until you are instructed otherwise.  If you have been sent home with a continuous passive motion machine, use it as directed by your health care provider. SEEK MEDICAL CARE IF:  You have difficulty breathing.  You have drainage, redness, swelling, or pain at your incision site.  You have a bad smell coming from your incision site.  You have persistent bleeding from your incision site.  Your incision breaks open after sutures (stitches) or staples have been removed.  You have a fever. SEEK IMMEDIATE MEDICAL CARE IF:   You have a rash.  You have pain or swelling in your calf or thigh.  You have shortness of breath or chest pain.  Your range of motion in your knee is decreasing rather than increasing. MAKE SURE YOU:   Understand these instructions.  Will watch your condition.  Will get help right away if you are not doing well or get worse. Document Released: 04/02/2005 Document Revised: 01/28/2014 Document Reviewed: 11/02/2011 [facility] Patient Information  2015 [place], [place]. This information is not intended to replace advice given to you by your health care provider. Make sure you discuss any questions you have with your health care provider.

## 2014-08-07 NOTE — Anesthesia Postprocedure Evaluation (Signed)
  Anesthesia Post-op Note  Patient: Hannah Hurst  Procedure(s) Performed: Procedure(s): RIGHT TOTAL KNEE ARTHROPLASTY (Right)  Patient Location: PACU  Anesthesia Type:General  Level of Consciousness: awake  Airway and Oxygen Therapy: Patient Spontanous Breathing  Post-op Pain: mild  Post-op Assessment: Post-op Vital signs reviewed  Post-op Vital Signs: Reviewed  Last Vitals:  Filed Vitals:   08/07/14 1330  BP: 106/82  Pulse: 65  Temp: 36.7 C  Resp: 16    Complications: No apparent anesthesia complications

## 2014-08-07 NOTE — H&P (View-Only) (Signed)
TOTAL KNEE ADMISSION H&P  Patient is being admitted for right total knee arthroplasty.  Subjective:  Chief Complaint:right knee pain.  HPI: Hannah Hurst, 53 y.o. female, has a history of pain and functional disability in the right knee due to arthritis and has failed non-surgical conservative treatments for greater than 12 weeks to includeNSAID's and/or analgesics, corticosteriod injections, viscosupplementation injections and activity modification.  Onset of symptoms was gradual, starting >10 years ago with gradually worsening course since that time. The patient noted no past surgery on the right knee(s).  Patient currently rates pain in the right knee(s) at 4 out of 10 with activity. Patient has night pain, worsening of pain with activity and weight bearing, crepitus and joint swelling.  Patient has evidence of subchondral cysts, subchondral sclerosis, periarticular osteophytes and joint space narrowing by imaging studies. There is no active infection.  There are no active problems to display for this patient.  No past medical history on file.  No past surgical history on file.   (Not in a hospital admission) Allergies not on file  History  Substance Use Topics  . Smoking status: Not on file  . Smokeless tobacco: Not on file  . Alcohol Use: Not on file    No family history on file.   Review of Systems  Constitutional: Negative.   HENT: Negative.   Eyes: Negative.   Respiratory: Negative.   Cardiovascular: Negative.   Gastrointestinal: Negative.   Genitourinary: Negative.   Musculoskeletal: Positive for joint pain.  Skin: Negative.   Neurological: Negative.   Endo/Heme/Allergies: Negative.   Psychiatric/Behavioral: Negative.     Objective:  Physical Exam  Constitutional: She is oriented to person, place, and time. She appears well-developed and well-nourished.  HENT:  Head: Normocephalic and atraumatic.  Eyes: EOM are normal. Pupils are equal, round, and reactive to  light.  Neck: Normal range of motion. Neck supple.  Cardiovascular: Normal rate, regular rhythm and normal heart sounds.  Exam reveals no gallop and no friction rub.   No murmur heard. Respiratory: Effort normal and breath sounds normal.  GI: Soft. Bowel sounds are normal.  Musculoskeletal:  On the right she has a 2-3+ effusion.  3+ patellofemoral crepitus.  Her alignment is not bad, but there are marked degenerative changes in the patellofemoral joint.  I am not getting a lot of tenderness medial or lateral compartment.  Her ligaments are otherwise stable.  Neurovascularly intact distally.  Neurological: She is alert and oriented to person, place, and time.  Skin: Skin is warm and dry.  Psychiatric: She has a normal mood and affect. Her behavior is normal. Judgment and thought content normal.    Vital signs in last 24 hours: @VSRANGES @  Labs:   There is no height or weight on file to calculate BMI.   Imaging Review Plain radiographs demonstrate severe degenerative joint disease of the right knee(s). The overall alignment isneutral. The bone quality appears to be fair for age and reported activity level.  Assessment/Plan:  End stage arthritis, right knee   The patient history, physical examination, clinical judgment of the provider and imaging studies are consistent with end stage degenerative joint disease of the right knee(s) and total knee arthroplasty is deemed medically necessary. The treatment options including medical management, injection therapy arthroscopy and arthroplasty were discussed at length. The risks and benefits of total knee arthroplasty were presented and reviewed. The risks due to aseptic loosening, infection, stiffness, patella tracking problems, thromboembolic complications and other imponderables were discussed. The  patient acknowledged the explanation, agreed to proceed with the plan and consent was signed. Patient is being admitted for inpatient treatment for  surgery, pain control, PT, OT, prophylactic antibiotics, VTE prophylaxis, progressive ambulation and ADL's and discharge planning. The patient is planning to be discharged home with home health services

## 2014-08-08 ENCOUNTER — Encounter (HOSPITAL_COMMUNITY): Payer: Self-pay | Admitting: General Practice

## 2014-08-08 LAB — CBC
HEMATOCRIT: 32.7 % — AB (ref 36.0–46.0)
Hemoglobin: 11 g/dL — ABNORMAL LOW (ref 12.0–15.0)
MCH: 31 pg (ref 26.0–34.0)
MCHC: 33.6 g/dL (ref 30.0–36.0)
MCV: 92.1 fL (ref 78.0–100.0)
Platelets: 229 10*3/uL (ref 150–400)
RBC: 3.55 MIL/uL — ABNORMAL LOW (ref 3.87–5.11)
RDW: 13.2 % (ref 11.5–15.5)
WBC: 12.9 10*3/uL — AB (ref 4.0–10.5)

## 2014-08-08 LAB — BASIC METABOLIC PANEL
Anion gap: 11 (ref 5–15)
BUN: 7 mg/dL (ref 6–23)
CHLORIDE: 104 meq/L (ref 96–112)
CO2: 25 meq/L (ref 19–32)
CREATININE: 0.52 mg/dL (ref 0.50–1.10)
Calcium: 8.8 mg/dL (ref 8.4–10.5)
GFR calc non Af Amer: >90 mL/min (ref >90)
Glucose, Bld: 167 mg/dL — ABNORMAL HIGH (ref 70–99)
Potassium: 4.2 mEq/L (ref 3.7–5.3)
Sodium: 140 mEq/L (ref 137–147)

## 2014-08-08 NOTE — Progress Notes (Signed)
Orthopedic Tech Progress Note Patient Details:  Hannah Hurst 1960-10-24 [id number] On cpm at 7:35 pm Patient ID: [name], female   DOB: [birth year], 53 y.o.   MRN: [id number]   Braulio Bosch 08/08/2014, 7:37 PM

## 2014-08-08 NOTE — Evaluation (Signed)
Physical Therapy Evaluation Patient Details Name: Hannah Hurst MRN: [id number] DOB: 1961-03-21 Today's Date: 08/08/2014   History of Present Illness  Pt. underwent R TKA for endstage arthritis  Clinical Impression  This patient underwent a right TKA and presents to PT with anticipated post-op decrease in strength and ROM, decreased functional mobility and gait.  Pt. Will benefit from acute PT to address these and below issues.  Pt. Made good progress first time up with PT.  Will plan to see later today.       Follow Up Recommendations Home health PT;Supervision for mobility/OOB    Equipment Recommendations  Other (comment) (equipment has already been delovered to home)    Recommendations for Other Services       Precautions / Restrictions Precautions Precautions: Knee Precaution Booklet Issued: Yes (comment) Precaution Comments: Provided pt. with exercise/precaution handout and initiated exercise instruction.  Reviewed positioning precautions. Required Braces or Orthoses: Knee Immobilizer - Right Restrictions Weight Bearing Restrictions: Yes RLE Weight Bearing: Weight bearing as tolerated Other Position/Activity Restrictions: reinforced no pillow under knee and encouraged use of "Bone Foam" positioner      Mobility  Bed Mobility Overal bed mobility: Needs Assistance Bed Mobility: Supine to Sit     Supine to sit: Min assist     General bed mobility comments: min assist for managing R LE to edge of bed  Transfers Overall transfer level: Needs assistance Equipment used: Rolling walker (2 wheeled) Transfers: Sit to/from Stand Sit to Stand: Min guard         General transfer comment: min guard assist for safety; vc's for hand placement and safe technique  Ambulation/Gait Ambulation/Gait assistance: Min guard Ambulation Distance (Feet): 30 Feet Assistive device: Rolling walker (2 wheeled) Gait Pattern/deviations: Step-to pattern;Decreased step length - right Gait  velocity: decreased   General Gait Details: no overt LOB noted; good sequencing with initial cueing; encouraged max WB as pt. can tolerate  Stairs            Wheelchair Mobility    Modified Rankin (Stroke Patients Only)       Balance                                             Pertinent Vitals/Pain Pain Assessment: 0-10 Pain Score: 5  Pain Location: right knee Pain Descriptors / Indicators: Aching;Penetrating Pain Intervention(s): Premedicated before session    Home Living Family/patient expects to be discharged to:: Private residence Living Arrangements: Alone Available Help at Discharge: Family;Available 24 hours/day (going to son's home post DC ) Type of Home: Apartment Home Access: Level entry     Home Layout: One level Home Equipment: Walker - 2 wheels;Bedside commode;Other (comment);Cane - single point (CPM)      Prior Function Level of Independence: Independent with assistive device(s)         Comments: occasional use of cane     Hand Dominance   Dominant Hand: Right    Extremity/Trunk Assessment   Upper Extremity Assessment: Overall WFL for tasks assessed           Lower Extremity Assessment: RLE deficits/detail RLE Deficits / Details: expected post op strength and ROM deficits; fair quad set; good ankle pump    Cervical / Trunk Assessment: Normal  Communication   Communication: No difficulties  Cognition Arousal/Alertness: Awake/alert Behavior During Therapy: WFL for tasks assessed/performed Overall Cognitive Status:  Within Functional Limits for tasks assessed                      General Comments      Exercises Total Joint Exercises Ankle Circles/Pumps: AROM;Both;10 reps;Supine Quad Sets: AROM;Both;10 reps;Supine Short Arc Quad: AAROM;AROM;Right;10 reps;Supine Knee Flexion: AROM;Right;5 reps;Seated Goniometric ROM: -15 to 65      Assessment/Plan    PT Assessment Patient needs continued PT  services  PT Diagnosis Difficulty walking;Acute pain   PT Problem List Decreased strength;Decreased range of motion;Decreased activity tolerance;Decreased mobility;Decreased knowledge of use of DME;Decreased knowledge of precautions;Pain  PT Treatment Interventions DME instruction;Gait training;Functional mobility training;Therapeutic activities;Therapeutic exercise;Patient/family education   PT Goals (Current goals can be found in the Care Plan section) Acute Rehab PT Goals Patient Stated Goal: home to son's apartment for initial recovery PT Goal Formulation: With patient Time For Goal Achievement: 08/15/14 Potential to Achieve Goals: Good    Frequency 7X/week   Barriers to discharge        Co-evaluation               End of Session Equipment Utilized During Treatment: Gait belt Activity Tolerance: Patient tolerated treatment well Patient left: in chair;with call bell/phone within reach Nurse Communication: Mobility status         Time: 0733-0803 PT Time Calculation (min) (ACUTE ONLY): 30 min   Charges:   PT Evaluation $Initial PT Evaluation Tier I: 1 Procedure PT Treatments $Gait Training: 8-22 mins $Therapeutic Exercise: 8-22 mins   PT G Codes:          Ladona Ridgel 08/08/2014, 10:52 AM Gerlean Ren PT Acute Rehab Services [phone] [place] [phone]

## 2014-08-08 NOTE — Care Management Note (Signed)
CARE MANAGEMENT NOTE 08/08/2014  Patient:  OLUWADEMILADE, KELLETT   Account Number:  [id number]  Date Initiated:  08/08/2014  Documentation initiated by:  Ricki Miller  Subjective/Objective Assessment:   53 yr old female admitted with left knee osteoarthritis. Patient had a left total knee arthroplasty.     Action/Plan:   Patient was preoperatively setup with [facility], no changes. Patient has family support at discharge.   Anticipated DC Date:  08/09/2014   Anticipated DC Plan:  [place]  CM consult      PAC Choice  [place]   Choice offered to / List presented to:  C-1 Patient   DME arranged  [place]  3-N-1      DME agency  [employer]     [place] arranged  HH-2 PT      [place]   Status of service:  Completed, signed off Medicare Important Message given?   (If response is "NO", the following Medicare IM given date fields will be blank) Date Medicare IM given:   Medicare IM given by:   Date Additional Medicare IM given:   Additional Medicare IM given by:    Discharge Disposition:  [place]  Per UR Regulation:  Reviewed for med. necessity/level of care/duration of stay

## 2014-08-08 NOTE — Evaluation (Signed)
Occupational Therapy Evaluation and Discharge Patient Details Name: Hannah Hurst MRN: [id number] DOB: 10-16-1960 Today's Date: 08/08/2014    History of Present Illness Pt. underwent R TKA for endstage arthritis   Clinical Impression   This 53 yo female admitted and underwent above presents to acute OT with all education completed and no further questions about BADLs, we will sign off.    Follow Up Recommendations  No OT follow up    Equipment Recommendations  None recommended by OT       Precautions / Restrictions Precautions Precautions: Knee Restrictions Weight Bearing Restrictions: No RLE Weight Bearing: Weight bearing as tolerated Other Position/Activity Restrictions: reinforced frequent knee presses while up in charir for increasing knee extension      Mobility Bed Mobility Overal bed mobility: Modified Independent Bed Mobility: Supine to Sit     Supine to sit: Modified independent (Device/Increase time)        Transfers Overall transfer level: Modified independent Equipment used: Rolling walker (2 wheeled) Transfers: Sit to/from Stand Sit to Stand: Supervision         General transfer comment: supervision for safety         ADL Overall ADL's : Needs assistance/impaired Eating/Feeding: Independent;Sitting   Grooming: Modified independent;Standing   Upper Body Bathing: Modified independent;Sitting;Standing   Lower Body Bathing: Minimal assistance (with Mod I sit<>stand)   Upper Body Dressing : Sitting;Modified independent   Lower Body Dressing: Modified independent;With adaptive equipment;Sit to/from stand   Toilet Transfer: Modified Independent;Ambulation;RW;BSC (over [employer])   Toileting- Water quality scientist and Hygiene: Modified independent;Sit to/from stand   Tub/ Shower Transfer: Tub transfer;Supervision/safety;Ambulation;Rolling walker;3 in 1                     Pertinent Vitals/Pain Pain Assessment: 0-10 Pain Score: 3   Pain Location: right knee Pain Descriptors / Indicators: Aching Pain Intervention(s): Monitored during session;Repositioned     Hand Dominance Right   Extremity/Trunk Assessment Upper Extremity Assessment Upper Extremity Assessment: Overall WFL for tasks assessed           Communication Communication Communication: No difficulties   Cognition Arousal/Alertness: Awake/alert Behavior During Therapy: WFL for tasks assessed/performed Overall Cognitive Status: Within Functional Limits for tasks assessed                                Home Living Family/patient expects to be discharged to:: Private residence Living Arrangements: Alone Available Help at Discharge: Family;Available 24 hours/day (going to son's house for a few days first) Type of Home: Apartment Home Access: Level entry     Home Layout: One level     Bathroom Shower/Tub: Tub/shower unit;Curtain Shower/tub characteristics: Architectural technologist: Standard     Home Equipment: Environmental consultant - 2 wheels;Bedside commode;Other (comment);Cane - single point (CPM)          Prior Functioning/Environment Level of Independence: Independent with assistive device(s)        Comments: occasional use of cane    OT Diagnosis: Generalized weakness;Acute pain         OT Goals(Current goals can be found in the care plan section) Acute Rehab OT Goals Patient Stated Goal: home tomorrow to son's  OT Frequency:                End of Session Activity Tolerance: Patient tolerated treatment well Patient left:  (walking with PT in hallway)   Time: 8309-4076 OT Time Calculation (min):  20 min Charges:  OT General Charges $OT Visit: 1 Procedure OT Evaluation $Initial OT Evaluation Tier I: 1 Procedure OT Treatments $Self Care/Home Management : 8-22 mins  Almon Register [phone] 08/08/2014, 4:49 PM

## 2014-08-08 NOTE — Op Note (Signed)
NAME:  Hannah, Hurst.:  [id number]  MEDICAL RECORD NO.:  [id number]  LOCATION:  [id number]                        FACILITY:  [facility]  PHYSICIAN:  Ninetta Lights, M.D. DATE OF BIRTH:  [birth year]  DATE OF PROCEDURE:  08/07/2014 DATE OF DISCHARGE:                              OPERATIVE REPORT   PREOPERATIVE DIAGNOSES:  Primary generalized arthritis, both knees, right greater than left.  End-stage changes tricompartmental on the right.  POSTOPERATIVE DIAGNOSES:  Primary generalized arthritis, both knees, right greater than left.  End-stage changes tricompartmental on the right.  OPERATIVE PROCEDURES: 1. Right knee modified minimally invasive total knee replacement,     Stryker Triathlon prosthesis.  A cemented pegged cruciate retaining     #4 femoral component.  Cemented #4 tibial component, 9-mm CS     insert.  Cemented resurfacing 32-mm pegged medial offset patellar     component. 2. Intraarticular injection of Depo-Medrol and Marcaine, left knee.  SURGEON:  Ninetta Lights, M.D.  ASSISTANT:  Doran Stabler, PA, present throughout the entire case and necessary for timely completion of procedure.  ANESTHESIA:  Spinal.  BLOOD LOSS:  Minimal.  SPECIMENS:  None.  CULTURES:  None.  COMPLICATIONS:  None.  DRESSINGS:  Soft compressive knee immobilizer on the right.  DRAINS:  Hemovac x1.  TOURNIQUET TIME:  One hour.  DESCRIPTION OF PROCEDURE:  The patient was brought to the operating room and after adequate anesthesia had been obtained, under sterile technique, injected intra-articularly, left knee with Depo-Medrol and Marcaine.  Attention was turned to the right.  Tourniquet applied. Prepped and draped in usual sterile fashion.  Exsanguinated with elevation of Esmarch.  Tourniquet inflated to 350 mmHg.  Straight incision above the patella and down to the tibial tubercle.  Skin and subcutaneous tissue divided.  Medial arthrotomy, vastus  splitting, preserving quad tendon.  Medial capsule release.  Grade 4 changes. Marked erosion on the patellofemoral joint, especially lateral trochlea. Grade 4 changes of medial compartment with erosion posteriorly. Intramedullary flexible guide, distal femur.  An 8-mm resection, 5 degrees of valgus.  Using epicondylar axis, the femur was sized, cut, and fitted for a pegged #4 cruciate retaining component.  Proximal tibial resection, extramedullary guide.  A 3-degree posterior slope cut. Sized to #4 component as well.  Patella was exposed.  Posterior 10-mm removed.  Drilled, sized, and fitted for a 32-mm component.  A 9-mm insert.  With this construct, I was very pleased with balancing of the knee, full extension, full flexion, good patellar tracking.  Tibia was marked for rotation and hand reamed.  All trials were removed.  Copious irrigation with a pulse irrigating device.  Cement prepared and placed on all components, firmly seated.  Polyethylene attached to tibia, knee reduced.  Patella held with a clamp.  Once the cement hardened, the knee was irrigated again.  Soft tissue was injected with Exparel.  Hemovac was placed.  Arthrotomy was closed with #1 Vicryl with the subcutaneous and subcuticular closure.  Margins were injected with Marcaine.  Sterile compressive dressing was applied.  Tourniquet was deflated and removed. Knee immobilizer was applied.  Anesthesia was reversed.  Brought to the  recovery room.  Tolerated the surgery well.  No complications.     Ninetta Lights, M.D.     DFM/MEDQ  D:  08/07/2014  T:  08/08/2014  Job:  [id number]

## 2014-08-08 NOTE — Plan of Care (Signed)
Problem: Phase I Progression Outcomes Goal: Dangle or out of bed evening of surgery Outcome: Completed/Met Date Met:  08/08/14

## 2014-08-08 NOTE — Progress Notes (Signed)
Physical Therapy Treatment Patient Details Name: Hannah Hurst MRN: [id number] DOB: May 11, 1961 Today's Date: 08/08/2014    History of Present Illness Pt. underwent R TKA for endstage arthritis    PT Comments    Pt. Making good progress with ambulation.  Expect she will be ready for Dc from PT perspective tomorrow.  Follow Up Recommendations  Home health PT;Supervision for mobility/OOB     Equipment Recommendations  None recommended by PT    Recommendations for Other Services       Precautions / Restrictions Precautions Precautions: Knee Restrictions Weight Bearing Restrictions: Yes RLE Weight Bearing: Weight bearing as tolerated Other Position/Activity Restrictions: reinforced frequent knee presses while up in charir for increasing knee extension    Mobility  Bed Mobility Overal bed mobility:  (not tested; pt. in therapy gym )                Transfers Overall transfer level: Needs assistance Equipment used: Rolling walker (2 wheeled) Transfers: Sit to/from Stand Sit to Stand: Supervision         General transfer comment: supervision for safety  Ambulation/Gait Ambulation/Gait assistance: Supervision Ambulation Distance (Feet): 150 Feet Assistive device: Rolling walker (2 wheeled) Gait Pattern/deviations: Step-through pattern     General Gait Details: cues for activating quads  at right heel strike through foot flat    Stairs            Wheelchair Mobility    Modified Rankin (Stroke Patients Only)       Balance                                    Cognition Arousal/Alertness: Awake/alert Behavior During Therapy: WFL for tasks assessed/performed Overall Cognitive Status: Within Functional Limits for tasks assessed                      Exercises      General Comments        Pertinent Vitals/Pain Pain Assessment: 0-10 Pain Score: 3  Pain Location: right knee Pain Descriptors / Indicators: Aching Pain  Intervention(s): Monitored during session;Repositioned    Home Living                      Prior Function            PT Goals (current goals can now be found in the care plan section) Progress towards PT goals: Progressing toward goals    Frequency  7X/week    PT Plan Current plan remains appropriate    Co-evaluation             End of Session   Activity Tolerance: Patient tolerated treatment well Patient left: in chair;with call bell/phone within reach     Time: 7782-4235 PT Time Calculation (min) (ACUTE ONLY): 13 min  Charges:  $Gait Training: 8-22 mins                    G Codes:      Ladona Ridgel 08/08/2014, 4:33 PM Gerlean Ren PT Acute Rehab Services [place] [phone]

## 2014-08-08 NOTE — Progress Notes (Signed)
Subjective: 1 Day Post-Op Procedure(s) (LRB): RIGHT TOTAL KNEE ARTHROPLASTY (Right) Patient reports pain as 4 on 0-10 scale.  Patient is very eager to progress with PT, but has been having increased pain.  No nausea/vomiting, lightheadedness/dizziness.  No flatus or bm yet.  Tolerating diet.  Objective: Vital signs in last 24 hours: Temp:  [97.7 F (36.5 C)-98.8 F (37.1 C)] 98.3 F (36.8 C) (11/12 0607) Pulse Rate:  [50-70] 57 (11/12 0607) Resp:  [11-20] 16 (11/12 0607) BP: (106-135)/(60-82) 111/60 mmHg (11/12 0607) SpO2:  [98 %-100 %] 100 % (11/12 0607) Weight:  [67.813 kg (149 lb 8 oz)] 67.813 kg (149 lb 8 oz) (11/11 1003)  Intake/Output from previous day: 11/11 0701 - 11/12 0700 In: 1350 [I.V.:1300; IV Piggyback:50] Out: 625 [Drains:600; Blood:25] Intake/Output this shift: Total I/O In: -  Out: 150 [Drains:150]   Recent Labs  08/08/14 0512  HGB 11.0*    Recent Labs  08/08/14 0512  WBC 12.9*  RBC 3.55*  HCT 32.7*  PLT 229    Recent Labs  08/08/14 0512  NA 140  K 4.2  CL 104  CO2 25  BUN 7  CREATININE 0.52  GLUCOSE 167*  CALCIUM 8.8   No results for input(s): LABPT, INR in the last 72 hours.  Neurologically intact Neurovascular intact Sensation intact distally Intact pulses distally Dorsiflexion/Plantar flexion intact Compartment soft  hemovac drain pulled by me today  Assessment/Plan: 1 Day Post-Op Procedure(s) (LRB): RIGHT TOTAL KNEE ARTHROPLASTY (Right) Advance diet Up with therapy D/C IV fluids Discharge home with home health tomorrow WBAT RLE ABLA-mild and stable  ANTON, M. LINDSEY 08/08/2014, 6:55 AM

## 2014-08-09 LAB — BASIC METABOLIC PANEL
Anion gap: 14 (ref 5–15)
BUN: 5 mg/dL — AB (ref 6–23)
CALCIUM: 8.7 mg/dL (ref 8.4–10.5)
CO2: 22 mEq/L (ref 19–32)
Chloride: 105 mEq/L (ref 96–112)
Creatinine, Ser: 0.53 mg/dL (ref 0.50–1.10)
GFR calc Af Amer: >90 mL/min (ref >90)
GLUCOSE: 96 mg/dL (ref 70–99)
Potassium: 3.9 mEq/L (ref 3.7–5.3)
Sodium: 141 mEq/L (ref 137–147)

## 2014-08-09 LAB — CBC
HEMATOCRIT: 31.2 % — AB (ref 36.0–46.0)
HEMOGLOBIN: 10.5 g/dL — AB (ref 12.0–15.0)
MCH: 30.8 pg (ref 26.0–34.0)
MCHC: 33.7 g/dL (ref 30.0–36.0)
MCV: 91.5 fL (ref 78.0–100.0)
Platelets: 187 10*3/uL (ref 150–400)
RBC: 3.41 MIL/uL — ABNORMAL LOW (ref 3.87–5.11)
RDW: 13.3 % (ref 11.5–15.5)
WBC: 8 10*3/uL (ref 4.0–10.5)

## 2014-08-09 MED ORDER — OXYCODONE HCL ER 20 MG PO T12A: 20.0000 mg | EXTENDED_RELEASE_TABLET | ORAL | Status: DC

## 2014-08-09 MED ORDER — OXYCODONE HCL ER 20 MG PO T12A
20.0000 mg | EXTENDED_RELEASE_TABLET | ORAL | Status: AC
  Administered 2014-08-09: 20 mg via ORAL
  Filled 2014-08-09: qty 1

## 2014-08-09 MED ORDER — DEXAMETHASONE SODIUM PHOSPHATE 10 MG/ML IJ SOLN
10.0000 mg | INTRAMUSCULAR | Status: AC
  Administered 2014-08-09: 10 mg via INTRAVENOUS
  Filled 2014-08-09 (×2): qty 1

## 2014-08-09 NOTE — Plan of Care (Signed)
Problem: Phase II Progression Outcomes Goal: Ambulates Outcome: Completed/Met Date Met:  08/09/14     

## 2014-08-09 NOTE — Progress Notes (Signed)
Physical Therapy Treatment Patient Details Name: Hannah Hurst MRN: [id number] DOB: 06-Mar-1961 Today's Date: 08/09/2014    History of Present Illness Pt. underwent R TKA for endstage arthritis    PT Comments    Eula Listen , ortho PA in during session.  Pt. Discussed her current pain issues with Ria Comment.  Pt. At mod I level with mobility and continues to progress with strength and ROM.  She is ready for DC home from PT standpoint.  Ice applied post session for added pain relief.    Follow Up Recommendations  Home health PT     Equipment Recommendations  None recommended by PT    Recommendations for Other Services       Precautions / Restrictions Precautions Precautions: Knee Precaution Comments: continued review of exercises and precautions Required Braces or Orthoses: Knee Immobilizer - Right Restrictions Weight Bearing Restrictions: Yes RLE Weight Bearing: Weight bearing as tolerated    Mobility  Bed Mobility Overal bed mobility: Modified Independent                Transfers Overall transfer level: Modified independent Equipment used: Rolling walker (2 wheeled) Transfers: Sit to/from Stand Sit to Stand: Modified independent (Device/Increase time)         General transfer comment: mod I and verbal cue to push from bed and not pull on RW   Ambulation/Gait Ambulation/Gait assistance: Modified independent (Device/Increase time) Ambulation Distance (Feet): 500 Feet Assistive device: Rolling walker (2 wheeled) Gait Pattern/deviations: Step-through pattern Gait velocity: improving   General Gait Details: good technique; nearly full weight beairing through R LE (pt. says this gives relief from pain); little dependance on UE assist through [employer]            Wheelchair Mobility    Modified Rankin (Stroke Patients Only)       Balance                                    Cognition Arousal/Alertness: Awake/alert Behavior During  Therapy: WFL for tasks assessed/performed Overall Cognitive Status: Within Functional Limits for tasks assessed                      Exercises Total Joint Exercises Ankle Circles/Pumps: AROM;Both;10 reps;Supine Quad Sets: AROM;Both;10 reps;Supine Towel Squeeze: AROM;Both;10 reps;Supine Short Arc Quad: AROM;Right;10 reps;Supine Hip ABduction/ADduction: AAROM;Right;10 reps;Supine (lift at heel to reduce friction) Straight Leg Raises: AAROM;Right;10 reps;Supine Knee Flexion: AROM;Right;5 reps;Seated Goniometric ROM: -10 to 85    General Comments        Pertinent Vitals/Pain Pain Assessment: 0-10 Pain Score: 8  Pain Location: right knee Pain Descriptors / Indicators: Aching Pain Intervention(s): Monitored during session;Premedicated before session;Repositioned;Relaxation;Ice applied    Home Living                      Prior Function            PT Goals (current goals can now be found in the care plan section) Progress towards PT goals: Progressing toward goals    Frequency  7X/week    PT Plan Current plan remains appropriate    Co-evaluation             End of Session Equipment Utilized During Treatment: Gait belt Activity Tolerance: Patient tolerated treatment well Patient left: in chair;with call bell/phone within reach     Time: 6269-4854 PT Time Calculation (  min) (ACUTE ONLY): 30 min  Charges:  $Gait Training: 8-22 mins $Therapeutic Exercise: 8-22 mins                    G Codes:      Ladona Ridgel 08/09/2014, 10:42 AM Gerlean Ren PT Acute Rehab Services [phone] [place] [phone]

## 2014-08-09 NOTE — Progress Notes (Signed)
Subjective: 2 Days Post-Op Procedure(s) (LRB): RIGHT TOTAL KNEE ARTHROPLASTY (Right) Patient reports pain as 4 on 0-10 scale.  Patient still in a lot of pain but is getting better.  No nausea/vomiting, lightheadedness/dizziness.  Positive flatus but no bm.  Tolerating diet.    Objective: Vital signs in last 24 hours: Temp:  [97 F (36.1 C)-98.4 F (36.9 C)] 98.4 F (36.9 C) (11/13 0544) Pulse Rate:  [57-71] 71 (11/13 0544) Resp:  [16] 16 (11/13 0544) BP: (110-145)/(68-83) 110/68 mmHg (11/13 0544) SpO2:  [99 %-100 %] 99 % (11/13 0544)  Intake/Output from previous day: 11/12 0701 - 11/13 0700 In: 480 [P.O.:480] Out: -  Intake/Output this shift:     Recent Labs  08/08/14 0512 08/09/14 0450  HGB 11.0* 10.5*    Recent Labs  08/08/14 0512 08/09/14 0450  WBC 12.9* 8.0  RBC 3.55* 3.41*  HCT 32.7* 31.2*  PLT 229 187    Recent Labs  08/08/14 0512 08/09/14 0450  NA 140 141  K 4.2 3.9  CL 104 105  CO2 25 22  BUN 7 5*  CREATININE 0.52 0.53  GLUCOSE 167* 96  CALCIUM 8.8 8.7   No results for input(s): LABPT, INR in the last 72 hours.  Neurologically intact Neurovascular intact Sensation intact distally Intact pulses distally Dorsiflexion/Plantar flexion intact Incision: scant drainage No cellulitis present Compartment soft  Dressing changed by me today  Assessment/Plan: 2 Days Post-Op Procedure(s) (LRB): RIGHT TOTAL KNEE ARTHROPLASTY (Right) Advance diet Up with therapy Discharge home with home health  WBAT RLE ABLA-mild and stable  ANTON, M. LINDSEY 08/09/2014, 8:24 AM

## 2014-08-09 NOTE — Plan of Care (Signed)
Problem: Acute Rehab PT Goals(only PT should resolve) Goal: Pt Will Go Supine/Side To Sit Outcome: Completed/Met Date Met:  08/09/14 Goal: Pt Will Go Sit To Supine/Side Outcome: Completed/Met Date Met:  08/09/14 Goal: Patient Will Transfer Sit To/From Stand Outcome: Completed/Met Date Met:  08/09/14 Goal: Pt Will Ambulate Outcome: Completed/Met Date Met:  08/09/14 Goal: Pt/caregiver will Perform Home Exercise Program Outcome: Adequate for Discharge

## 2014-08-11 NOTE — Addendum Note (Signed)
Addendum  created 08/11/14 1454 by Laurie Panda, MD   Modules edited: Anesthesia Attestations

## 2014-09-02 ENCOUNTER — Inpatient Hospital Stay: Payer: Self-pay | Admitting: Surgery

## 2014-09-02 LAB — CBC WITH DIFFERENTIAL/PLATELET
Basophil #: 0.1 10*3/uL (ref 0.0–0.1)
Basophil %: 0.6 %
EOS ABS: 0.1 10*3/uL (ref 0.0–0.7)
EOS PCT: 0.4 %
HCT: 36.6 % (ref 35.0–47.0)
HGB: 11.8 g/dL — ABNORMAL LOW (ref 12.0–16.0)
LYMPHS PCT: 14.1 %
Lymphocyte #: 2 10*3/uL (ref 1.0–3.6)
MCH: 29.6 pg (ref 26.0–34.0)
MCHC: 32.3 g/dL (ref 32.0–36.0)
MCV: 92 fL (ref 80–100)
Monocyte #: 1.2 x10 3/mm — ABNORMAL HIGH (ref 0.2–0.9)
Monocyte %: 8.8 %
NEUTROS ABS: 10.7 10*3/uL — AB (ref 1.4–6.5)
NEUTROS PCT: 76.1 %
PLATELETS: 504 10*3/uL — AB (ref 150–440)
RBC: 4 10*6/uL (ref 3.80–5.20)
RDW: 13.1 % (ref 11.5–14.5)
WBC: 14.1 10*3/uL — ABNORMAL HIGH (ref 3.6–11.0)

## 2014-09-02 LAB — URINALYSIS, COMPLETE
Blood: NEGATIVE
Glucose,UR: NEGATIVE mg/dL (ref 0–75)
LEUKOCYTE ESTERASE: NEGATIVE
Nitrite: NEGATIVE
PH: 5 (ref 4.5–8.0)
Specific Gravity: 1.031 (ref 1.003–1.030)
Squamous Epithelial: 2
WBC UR: <1 /HPF (ref 0–5)

## 2014-09-02 LAB — COMPREHENSIVE METABOLIC PANEL
ALBUMIN: 3.6 g/dL (ref 3.4–5.0)
ALT: 17 U/L
AST: 18 U/L (ref 15–37)
Alkaline Phosphatase: 100 U/L
Anion Gap: 10 (ref 7–16)
BILIRUBIN TOTAL: 0.2 mg/dL (ref 0.2–1.0)
BUN: 9 mg/dL (ref 7–18)
CO2: 27 mmol/L (ref 21–32)
Calcium, Total: 9.1 mg/dL (ref 8.5–10.1)
Chloride: 102 mmol/L (ref 98–107)
Creatinine: 0.61 mg/dL (ref 0.60–1.30)
EGFR (African American): >60
Glucose: 98 mg/dL (ref 65–99)
OSMOLALITY: 276 (ref 275–301)
Potassium: 3.1 mmol/L — ABNORMAL LOW (ref 3.5–5.1)
SODIUM: 139 mmol/L (ref 136–145)
Total Protein: 7.6 g/dL (ref 6.4–8.2)

## 2014-09-03 LAB — CBC WITH DIFFERENTIAL/PLATELET
BASOS ABS: 0.1 10*3/uL (ref 0.0–0.1)
BASOS PCT: 0.7 %
EOS ABS: 0.2 10*3/uL (ref 0.0–0.7)
Eosinophil %: 2.2 %
HCT: 36.3 % (ref 35.0–47.0)
HGB: 12 g/dL (ref 12.0–16.0)
LYMPHS ABS: 2.4 10*3/uL (ref 1.0–3.6)
Lymphocyte %: 22.5 %
MCH: 30.4 pg (ref 26.0–34.0)
MCHC: 33 g/dL (ref 32.0–36.0)
MCV: 92 fL (ref 80–100)
Monocyte #: 0.9 x10 3/mm (ref 0.2–0.9)
Monocyte %: 8.8 %
Neutrophil #: 6.9 10*3/uL — ABNORMAL HIGH (ref 1.4–6.5)
Neutrophil %: 65.8 %
Platelet: 447 10*3/uL — ABNORMAL HIGH (ref 150–440)
RBC: 3.94 10*6/uL (ref 3.80–5.20)
RDW: 13.4 % (ref 11.5–14.5)
WBC: 10.5 10*3/uL (ref 3.6–11.0)

## 2014-09-03 LAB — BASIC METABOLIC PANEL
Anion Gap: 6 — ABNORMAL LOW (ref 7–16)
BUN: 8 mg/dL (ref 7–18)
CALCIUM: 9.2 mg/dL (ref 8.5–10.1)
Chloride: 102 mmol/L (ref 98–107)
Co2: 30 mmol/L (ref 21–32)
Creatinine: 0.63 mg/dL (ref 0.60–1.30)
GLUCOSE: 87 mg/dL (ref 65–99)
Osmolality: 273 (ref 275–301)
Potassium: 3 mmol/L — ABNORMAL LOW (ref 3.5–5.1)
SODIUM: 138 mmol/L (ref 136–145)

## 2014-09-04 LAB — CBC WITH DIFFERENTIAL/PLATELET
Basophil #: 0.1 10*3/uL (ref 0.0–0.1)
Basophil %: 0.7 %
Eosinophil #: 0.2 10*3/uL (ref 0.0–0.7)
Eosinophil %: 2.2 %
HCT: 31.2 % — ABNORMAL LOW (ref 35.0–47.0)
HGB: 10 g/dL — ABNORMAL LOW (ref 12.0–16.0)
LYMPHS PCT: 16.4 %
Lymphocyte #: 1.5 10*3/uL (ref 1.0–3.6)
MCH: 29.6 pg (ref 26.0–34.0)
MCHC: 32.1 g/dL (ref 32.0–36.0)
MCV: 92 fL (ref 80–100)
MONO ABS: 1 x10 3/mm — AB (ref 0.2–0.9)
MONOS PCT: 10.5 %
Neutrophil #: 6.4 10*3/uL (ref 1.4–6.5)
Neutrophil %: 70.2 %
Platelet: 339 10*3/uL (ref 150–440)
RBC: 3.39 10*6/uL — ABNORMAL LOW (ref 3.80–5.20)
RDW: 13.3 % (ref 11.5–14.5)
WBC: 9.2 10*3/uL (ref 3.6–11.0)

## 2014-09-04 LAB — BASIC METABOLIC PANEL
Anion Gap: 0 — ABNORMAL LOW (ref 7–16)
BUN: 5 mg/dL — ABNORMAL LOW (ref 7–18)
CO2: 29 mmol/L (ref 21–32)
CREATININE: 0.56 mg/dL — AB (ref 0.60–1.30)
Calcium, Total: 8.4 mg/dL — ABNORMAL LOW (ref 8.5–10.1)
Chloride: 106 mmol/L (ref 98–107)
EGFR (African American): >60
GLUCOSE: 94 mg/dL (ref 65–99)
OSMOLALITY: 267 (ref 275–301)
POTASSIUM: 3.5 mmol/L (ref 3.5–5.1)
SODIUM: 135 mmol/L — AB (ref 136–145)

## 2014-09-05 LAB — CBC WITH DIFFERENTIAL/PLATELET
BASOS PCT: 0.5 %
Basophil #: 0 10*3/uL (ref 0.0–0.1)
Eosinophil #: 0.2 10*3/uL (ref 0.0–0.7)
Eosinophil %: 2.8 %
HCT: 31 % — AB (ref 35.0–47.0)
HGB: 10 g/dL — ABNORMAL LOW (ref 12.0–16.0)
LYMPHS ABS: 1.5 10*3/uL (ref 1.0–3.6)
Lymphocyte %: 21.1 %
MCH: 29.6 pg (ref 26.0–34.0)
MCHC: 32.1 g/dL (ref 32.0–36.0)
MCV: 92 fL (ref 80–100)
Monocyte #: 0.8 x10 3/mm (ref 0.2–0.9)
Monocyte %: 11.6 %
NEUTROS PCT: 64 %
Neutrophil #: 4.6 10*3/uL (ref 1.4–6.5)
PLATELETS: 306 10*3/uL (ref 150–440)
RBC: 3.37 10*6/uL — AB (ref 3.80–5.20)
RDW: 13.1 % (ref 11.5–14.5)
WBC: 7.1 10*3/uL (ref 3.6–11.0)

## 2014-09-05 LAB — BASIC METABOLIC PANEL
ANION GAP: 4 — AB (ref 7–16)
BUN: 3 mg/dL — AB (ref 7–18)
CALCIUM: 8.4 mg/dL — AB (ref 8.5–10.1)
Chloride: 104 mmol/L (ref 98–107)
Co2: 31 mmol/L (ref 21–32)
Creatinine: 0.55 mg/dL — ABNORMAL LOW (ref 0.60–1.30)
EGFR (Non-African Amer.): >60
GLUCOSE: 86 mg/dL (ref 65–99)
Osmolality: 273 (ref 275–301)
Potassium: 3.7 mmol/L (ref 3.5–5.1)
SODIUM: 139 mmol/L (ref 136–145)

## 2014-09-07 LAB — CBC WITH DIFFERENTIAL/PLATELET
Basophil #: 0.1 10*3/uL (ref 0.0–0.1)
Basophil %: 0.5 %
Eosinophil #: 0 10*3/uL (ref 0.0–0.7)
Eosinophil %: 0.1 %
HCT: 35.1 % (ref 35.0–47.0)
HGB: 11.1 g/dL — ABNORMAL LOW (ref 12.0–16.0)
LYMPHS ABS: 1.1 10*3/uL (ref 1.0–3.6)
Lymphocyte %: 6.1 %
MCH: 28.9 pg (ref 26.0–34.0)
MCHC: 31.6 g/dL — ABNORMAL LOW (ref 32.0–36.0)
MCV: 91 fL (ref 80–100)
MONOS PCT: 8.5 %
Monocyte #: 1.5 x10 3/mm — ABNORMAL HIGH (ref 0.2–0.9)
NEUTROS PCT: 84.8 %
Neutrophil #: 14.7 10*3/uL — ABNORMAL HIGH (ref 1.4–6.5)
Platelet: 353 10*3/uL (ref 150–440)
RBC: 3.84 10*6/uL (ref 3.80–5.20)
RDW: 13.4 % (ref 11.5–14.5)
WBC: 17.3 10*3/uL — ABNORMAL HIGH (ref 3.6–11.0)

## 2014-09-07 LAB — BASIC METABOLIC PANEL
Anion Gap: 5 — ABNORMAL LOW (ref 7–16)
BUN: 4 mg/dL — AB (ref 7–18)
CREATININE: 0.53 mg/dL — AB (ref 0.60–1.30)
Calcium, Total: 8.5 mg/dL (ref 8.5–10.1)
Chloride: 103 mmol/L (ref 98–107)
Co2: 29 mmol/L (ref 21–32)
Glucose: 119 mg/dL — ABNORMAL HIGH (ref 65–99)
Osmolality: 272 (ref 275–301)
Potassium: 4 mmol/L (ref 3.5–5.1)
Sodium: 137 mmol/L (ref 136–145)

## 2014-09-08 LAB — CBC WITH DIFFERENTIAL/PLATELET
BASOS PCT: 0.2 %
Basophil #: 0 10*3/uL (ref 0.0–0.1)
EOS ABS: 0.4 10*3/uL (ref 0.0–0.7)
Eosinophil %: 3.4 %
HCT: 34.7 % — ABNORMAL LOW (ref 35.0–47.0)
HGB: 11.3 g/dL — ABNORMAL LOW (ref 12.0–16.0)
LYMPHS ABS: 1.3 10*3/uL (ref 1.0–3.6)
Lymphocyte %: 10.1 %
MCH: 29.4 pg (ref 26.0–34.0)
MCHC: 32.5 g/dL (ref 32.0–36.0)
MCV: 91 fL (ref 80–100)
MONO ABS: 1.3 x10 3/mm — AB (ref 0.2–0.9)
Monocyte %: 10.6 %
NEUTROS ABS: 9.4 10*3/uL — AB (ref 1.4–6.5)
Neutrophil %: 75.7 %
Platelet: 319 10*3/uL (ref 150–440)
RBC: 3.83 10*6/uL (ref 3.80–5.20)
RDW: 13.6 % (ref 11.5–14.5)
WBC: 12.4 10*3/uL — ABNORMAL HIGH (ref 3.6–11.0)

## 2014-09-08 LAB — BASIC METABOLIC PANEL
Anion Gap: 6 — ABNORMAL LOW (ref 7–16)
BUN: 3 mg/dL — ABNORMAL LOW (ref 7–18)
CHLORIDE: 101 mmol/L (ref 98–107)
Calcium, Total: 8.6 mg/dL (ref 8.5–10.1)
Co2: 30 mmol/L (ref 21–32)
Creatinine: 0.59 mg/dL — ABNORMAL LOW (ref 0.60–1.30)
Glucose: 95 mg/dL (ref 65–99)
Osmolality: 270 (ref 275–301)
POTASSIUM: 4.1 mmol/L (ref 3.5–5.1)
Sodium: 137 mmol/L (ref 136–145)

## 2014-09-12 LAB — CBC WITH DIFFERENTIAL/PLATELET
BASOS ABS: 0.1 10*3/uL (ref 0.0–0.1)
Basophil %: 0.4 %
EOS ABS: 0.6 10*3/uL (ref 0.0–0.7)
Eosinophil %: 4 %
HCT: 33.9 % — AB (ref 35.0–47.0)
HGB: 11 g/dL — AB (ref 12.0–16.0)
LYMPHS ABS: 1.5 10*3/uL (ref 1.0–3.6)
Lymphocyte %: 9.7 %
MCH: 29.4 pg (ref 26.0–34.0)
MCHC: 32.6 g/dL (ref 32.0–36.0)
MCV: 90 fL (ref 80–100)
Monocyte #: 1.4 x10 3/mm — ABNORMAL HIGH (ref 0.2–0.9)
Monocyte %: 9.5 %
Neutrophil #: 11.5 10*3/uL — ABNORMAL HIGH (ref 1.4–6.5)
Neutrophil %: 76.4 %
Platelet: 264 10*3/uL (ref 150–440)
RBC: 3.76 10*6/uL — ABNORMAL LOW (ref 3.80–5.20)
RDW: 13.1 % (ref 11.5–14.5)
WBC: 15.1 10*3/uL — AB (ref 3.6–11.0)

## 2014-09-25 ENCOUNTER — Observation Stay: Payer: Self-pay | Admitting: Surgery

## 2014-09-25 LAB — COMPREHENSIVE METABOLIC PANEL
Albumin: 3.7 g/dL (ref 3.4–5.0)
Alkaline Phosphatase: 185 U/L — ABNORMAL HIGH
Anion Gap: 7 (ref 7–16)
BILIRUBIN TOTAL: 0.4 mg/dL (ref 0.2–1.0)
BUN: 12 mg/dL (ref 7–18)
CALCIUM: 10 mg/dL (ref 8.5–10.1)
Chloride: 102 mmol/L (ref 98–107)
Co2: 28 mmol/L (ref 21–32)
Creatinine: 0.88 mg/dL (ref 0.60–1.30)
EGFR (Non-African Amer.): >60
GLUCOSE: 106 mg/dL — AB (ref 65–99)
Osmolality: 274 (ref 275–301)
Potassium: 3.2 mmol/L — ABNORMAL LOW (ref 3.5–5.1)
SGOT(AST): 30 U/L (ref 15–37)
SGPT (ALT): 48 U/L
Sodium: 137 mmol/L (ref 136–145)
Total Protein: 8 g/dL (ref 6.4–8.2)

## 2014-09-25 LAB — URINALYSIS, COMPLETE
Bilirubin,UR: NEGATIVE
Blood: NEGATIVE
Glucose,UR: NEGATIVE mg/dL (ref 0–75)
Hyaline Cast: 6
Ketone: NEGATIVE
Nitrite: NEGATIVE
Ph: 5 (ref 4.5–8.0)
SPECIFIC GRAVITY: 1.02 (ref 1.003–1.030)
WBC UR: 24 /HPF (ref 0–5)

## 2014-09-25 LAB — CBC WITH DIFFERENTIAL/PLATELET
Basophil #: 0 10*3/uL (ref 0.0–0.1)
Basophil %: 0.6 %
Eosinophil #: 0.4 10*3/uL (ref 0.0–0.7)
Eosinophil %: 5.2 %
HCT: 38.2 % (ref 35.0–47.0)
HGB: 12.5 g/dL (ref 12.0–16.0)
LYMPHS ABS: 1.9 10*3/uL (ref 1.0–3.6)
LYMPHS PCT: 23.1 %
MCH: 29.1 pg (ref 26.0–34.0)
MCHC: 32.7 g/dL (ref 32.0–36.0)
MCV: 89 fL (ref 80–100)
Monocyte #: 0.8 x10 3/mm (ref 0.2–0.9)
Monocyte %: 9.1 %
NEUTROS ABS: 5.2 10*3/uL (ref 1.4–6.5)
Neutrophil %: 62 %
Platelet: 476 10*3/uL — ABNORMAL HIGH (ref 150–440)
RBC: 4.3 10*6/uL (ref 3.80–5.20)
RDW: 13.5 % (ref 11.5–14.5)
WBC: 8.4 10*3/uL (ref 3.6–11.0)

## 2014-09-26 LAB — CBC WITH DIFFERENTIAL/PLATELET
BASOS ABS: 0 10*3/uL (ref 0.0–0.1)
Basophil %: 0.6 %
Eosinophil #: 0.8 10*3/uL — ABNORMAL HIGH (ref 0.0–0.7)
Eosinophil %: 10.3 %
HCT: 32.6 % — ABNORMAL LOW (ref 35.0–47.0)
HGB: 10.8 g/dL — AB (ref 12.0–16.0)
LYMPHS ABS: 1.9 10*3/uL (ref 1.0–3.6)
Lymphocyte %: 24.8 %
MCH: 29.2 pg (ref 26.0–34.0)
MCHC: 33.2 g/dL (ref 32.0–36.0)
MCV: 88 fL (ref 80–100)
MONOS PCT: 9.2 %
Monocyte #: 0.7 x10 3/mm (ref 0.2–0.9)
NEUTROS ABS: 4.3 10*3/uL (ref 1.4–6.5)
Neutrophil %: 55.1 %
Platelet: 366 10*3/uL (ref 150–440)
RBC: 3.71 10*6/uL — AB (ref 3.80–5.20)
RDW: 13.2 % (ref 11.5–14.5)
WBC: 7.8 10*3/uL (ref 3.6–11.0)

## 2014-09-26 LAB — BASIC METABOLIC PANEL
Anion Gap: 5 — ABNORMAL LOW (ref 7–16)
BUN: 8 mg/dL (ref 7–18)
CALCIUM: 9.2 mg/dL (ref 8.5–10.1)
Chloride: 105 mmol/L (ref 98–107)
Co2: 29 mmol/L (ref 21–32)
Creatinine: 0.67 mg/dL (ref 0.60–1.30)
EGFR (African American): >60
Glucose: 87 mg/dL (ref 65–99)
Osmolality: 275 (ref 275–301)
Potassium: 3.5 mmol/L (ref 3.5–5.1)
Sodium: 139 mmol/L (ref 136–145)

## 2014-09-27 DIAGNOSIS — Z9889 Other specified postprocedural states: Secondary | ICD-10-CM

## 2014-09-27 HISTORY — PX: OTHER SURGICAL HISTORY: SHX169

## 2014-09-27 HISTORY — DX: Other specified postprocedural states: Z98.890

## 2014-09-27 LAB — BASIC METABOLIC PANEL
Anion Gap: 7 (ref 7–16)
BUN: 4 mg/dL — ABNORMAL LOW (ref 7–18)
CHLORIDE: 103 mmol/L (ref 98–107)
Calcium, Total: 8.8 mg/dL (ref 8.5–10.1)
Co2: 30 mmol/L (ref 21–32)
Creatinine: 0.68 mg/dL (ref 0.60–1.30)
EGFR (African American): >60
EGFR (Non-African Amer.): >60
Glucose: 79 mg/dL (ref 65–99)
OSMOLALITY: 275 (ref 275–301)
POTASSIUM: 3 mmol/L — AB (ref 3.5–5.1)
Sodium: 140 mmol/L (ref 136–145)

## 2014-09-27 LAB — CBC WITH DIFFERENTIAL/PLATELET
Basophil #: 0 10*3/uL (ref 0.0–0.1)
Basophil %: 0.5 %
Eosinophil #: 0.7 10*3/uL (ref 0.0–0.7)
Eosinophil %: 11.4 %
HCT: 33.1 % — AB (ref 35.0–47.0)
HGB: 11.1 g/dL — ABNORMAL LOW (ref 12.0–16.0)
LYMPHS PCT: 29.2 %
Lymphocyte #: 1.8 10*3/uL (ref 1.0–3.6)
MCH: 29.2 pg (ref 26.0–34.0)
MCHC: 33.4 g/dL (ref 32.0–36.0)
MCV: 88 fL (ref 80–100)
MONOS PCT: 12.5 %
Monocyte #: 0.8 x10 3/mm (ref 0.2–0.9)
NEUTROS ABS: 2.9 10*3/uL (ref 1.4–6.5)
Neutrophil %: 46.4 %
Platelet: 341 10*3/uL (ref 150–440)
RBC: 3.78 10*6/uL — ABNORMAL LOW (ref 3.80–5.20)
RDW: 13.1 % (ref 11.5–14.5)
WBC: 6.1 10*3/uL (ref 3.6–11.0)

## 2014-09-27 LAB — HEPATIC FUNCTION PANEL A (ARMC)
ALBUMIN: 3 g/dL — AB (ref 3.4–5.0)
ALT: 60 U/L
Alkaline Phosphatase: 147 U/L — ABNORMAL HIGH
Bilirubin, Direct: <0.1 mg/dL (ref 0.0–0.2)
Bilirubin,Total: 0.3 mg/dL (ref 0.2–1.0)
SGOT(AST): 45 U/L — ABNORMAL HIGH (ref 15–37)
TOTAL PROTEIN: 6.3 g/dL — AB (ref 6.4–8.2)

## 2014-10-23 ENCOUNTER — Ambulatory Visit: Payer: Self-pay | Admitting: Anesthesiology

## 2014-10-23 LAB — POTASSIUM: POTASSIUM: 3.7 mmol/L (ref 3.5–5.1)

## 2014-10-31 ENCOUNTER — Inpatient Hospital Stay: Payer: Self-pay | Admitting: Surgery

## 2014-10-31 LAB — CREATININE, SERUM
CREATININE: 0.63 mg/dL (ref 0.60–1.30)
EGFR (African American): >60
EGFR (Non-African Amer.): >60

## 2014-11-01 LAB — CBC WITH DIFFERENTIAL/PLATELET
Basophil #: 0.1 10*3/uL (ref 0.0–0.1)
Basophil %: 0.5 %
EOS PCT: 0 %
Eosinophil #: 0 10*3/uL (ref 0.0–0.7)
HCT: 37.7 % (ref 35.0–47.0)
HGB: 12.4 g/dL (ref 12.0–16.0)
Lymphocyte #: 1 10*3/uL (ref 1.0–3.6)
Lymphocyte %: 6 %
MCH: 28.5 pg (ref 26.0–34.0)
MCHC: 33 g/dL (ref 32.0–36.0)
MCV: 86 fL (ref 80–100)
MONOS PCT: 4.2 %
Monocyte #: 0.7 x10 3/mm (ref 0.2–0.9)
NEUTROS ABS: 15 10*3/uL — AB (ref 1.4–6.5)
Neutrophil %: 89.3 %
Platelet: 254 10*3/uL (ref 150–440)
RBC: 4.37 10*6/uL (ref 3.80–5.20)
RDW: 14.6 % — AB (ref 11.5–14.5)
WBC: 16.8 10*3/uL — AB (ref 3.6–11.0)

## 2014-11-01 LAB — BASIC METABOLIC PANEL
Anion Gap: 9 (ref 7–16)
BUN: 8 mg/dL (ref 7–18)
CALCIUM: 8.6 mg/dL (ref 8.5–10.1)
Chloride: 107 mmol/L (ref 98–107)
Co2: 22 mmol/L (ref 21–32)
Creatinine: 0.68 mg/dL (ref 0.60–1.30)
GLUCOSE: 128 mg/dL — AB (ref 65–99)
Osmolality: 276 (ref 275–301)
Potassium: 4.1 mmol/L (ref 3.5–5.1)
Sodium: 138 mmol/L (ref 136–145)

## 2014-11-22 ENCOUNTER — Ambulatory Visit: Payer: Self-pay | Admitting: Surgery

## 2015-01-03 ENCOUNTER — Ambulatory Visit
Admit: 2015-01-03 | Disposition: A | Discharge status: Home / Self Care | Payer: Self-pay | Attending: Gastroenterology | Admitting: Gastroenterology

## 2015-01-10 IMAGING — CT CT PELVIS W/O CM
2 of 3 series · 16 of 46 positions shown, 18 images · non-contrast
Comparison: 09/02/2014

CLINICAL DATA: Passing gas and stool through the vagina which is
increased in the interval from the prior exam.

EXAM:
CT PELVIS WITHOUT CONTRAST
TECHNIQUE: Multidetector CT imaging of the pelvis was performed following the
standard protocol without intravenous contrast.

[Series 2: hip soft tissue cor · coronal · 0.50mm/px · 3 of 105 slices shown]
[im 35/105  soft-tissue]
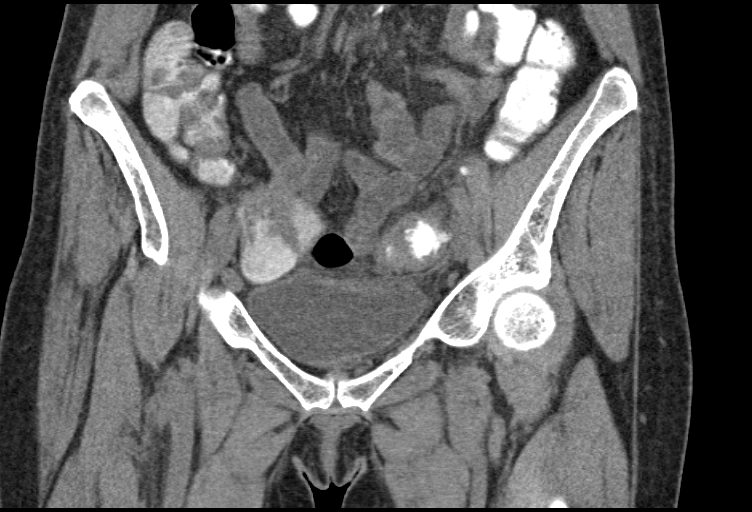
[im 47/105  soft-tissue]
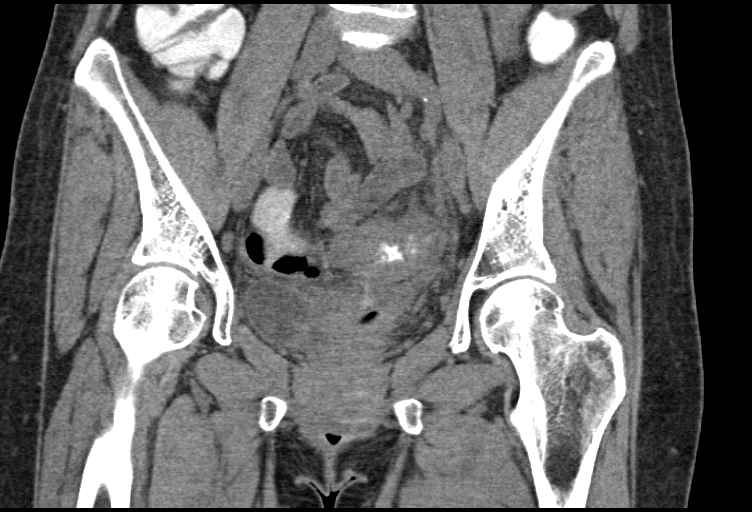
[im 58/105  soft-tissue]
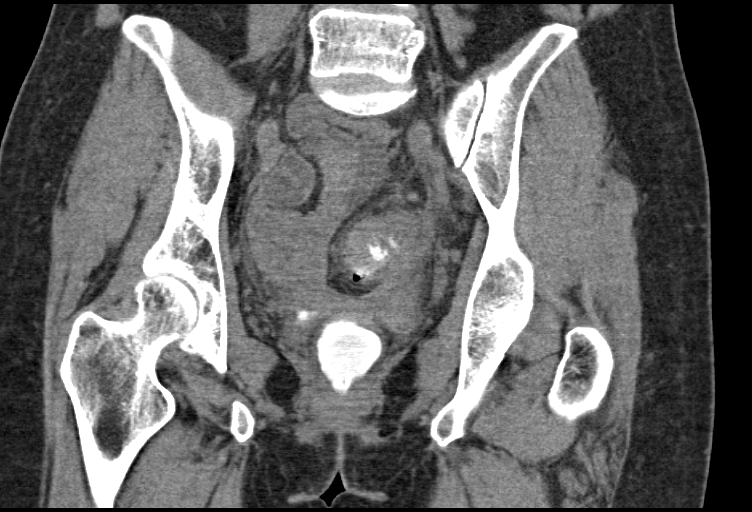

[Series 6: hip soft tissue · axial · 0.70mm/px · z∈[-914,-698]mm · 13 of 126 slices shown, 15 images]
[im 9/126  soft-tissue]
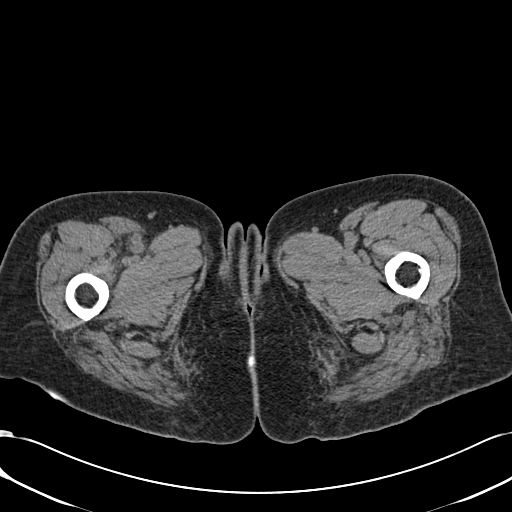
[im 9/126  bone]
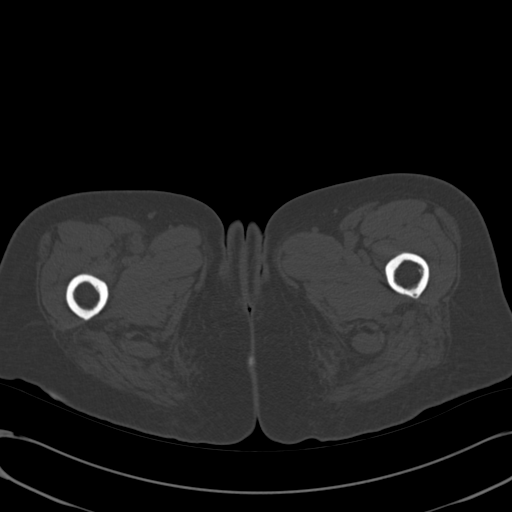
[im 17/126  soft-tissue]
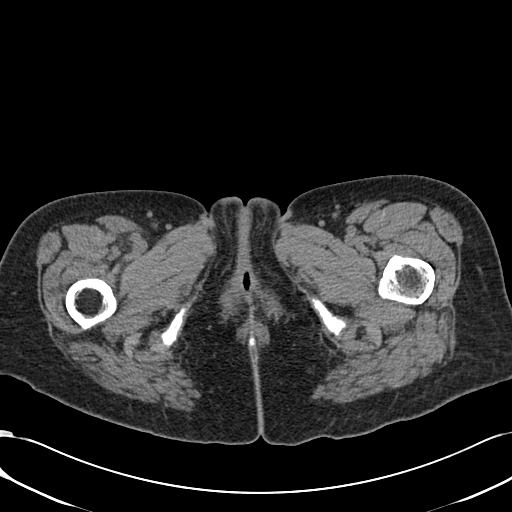
[im 25/126  soft-tissue]
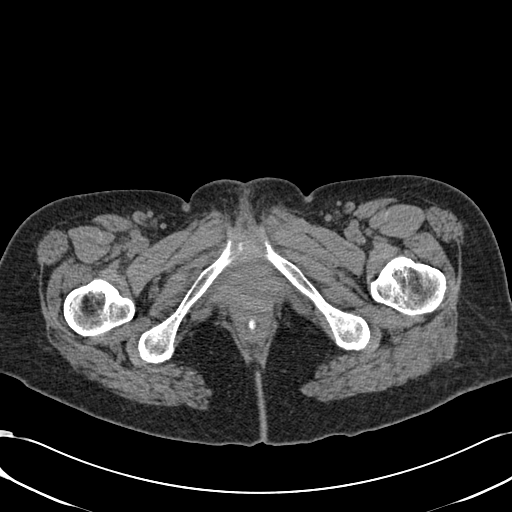
[im 37/126  soft-tissue]
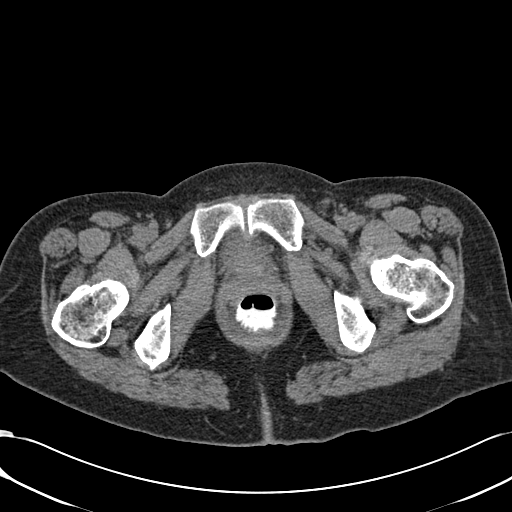
[im 45/126  soft-tissue]
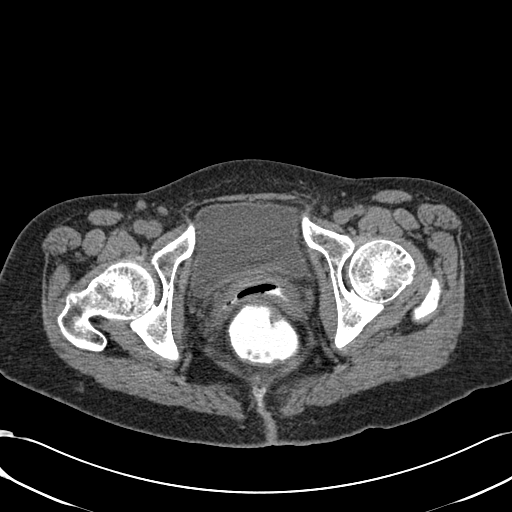
[im 53/126  soft-tissue]
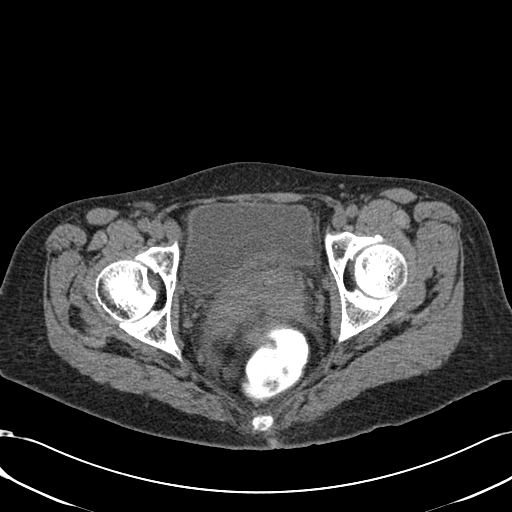
[im 65/126  soft-tissue]
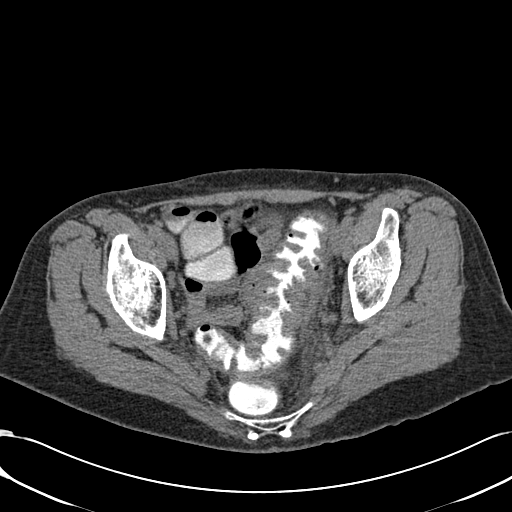
[im 73/126  soft-tissue]
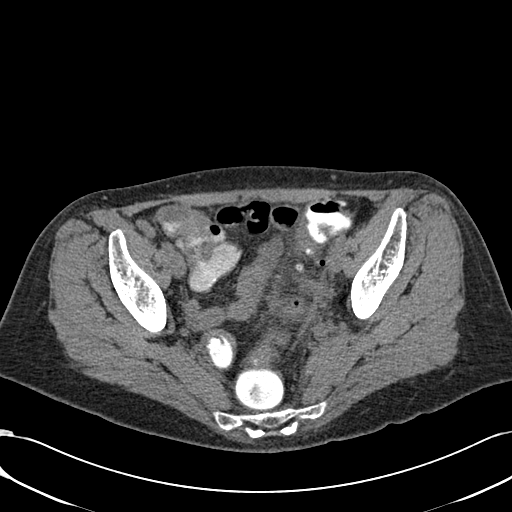
[im 81/126  soft-tissue]
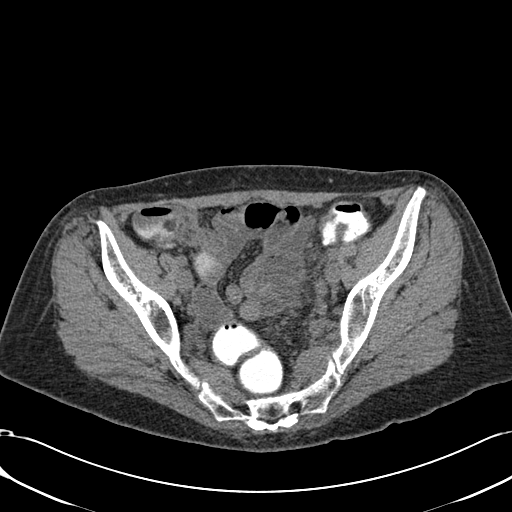
[im 81/126  bone]
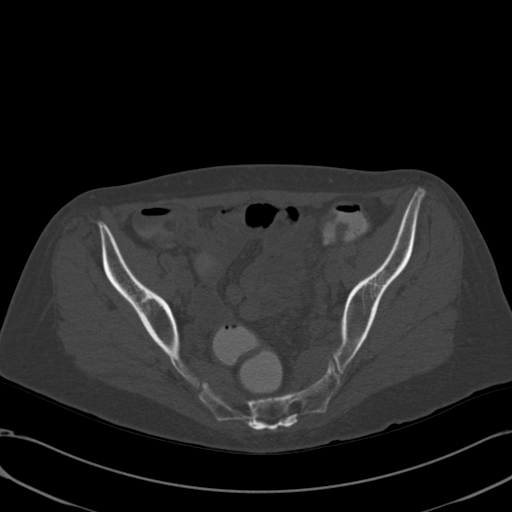
[im 89/126  soft-tissue]
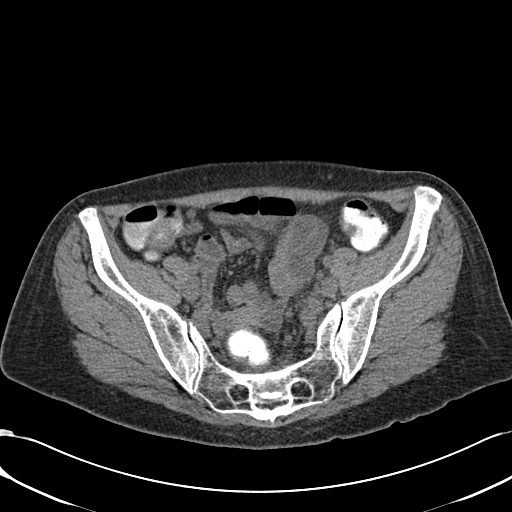
[im 101/126  soft-tissue]
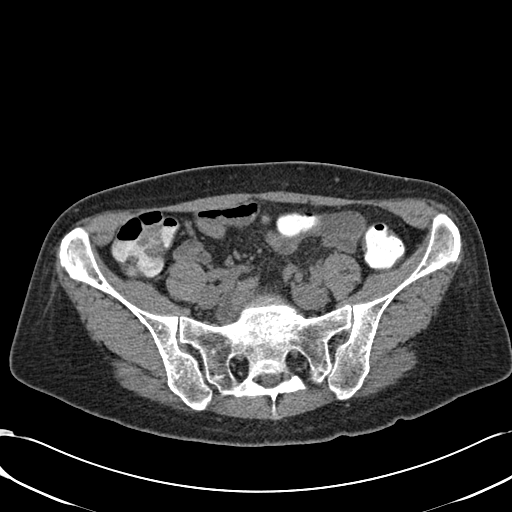
[im 109/126  soft-tissue]
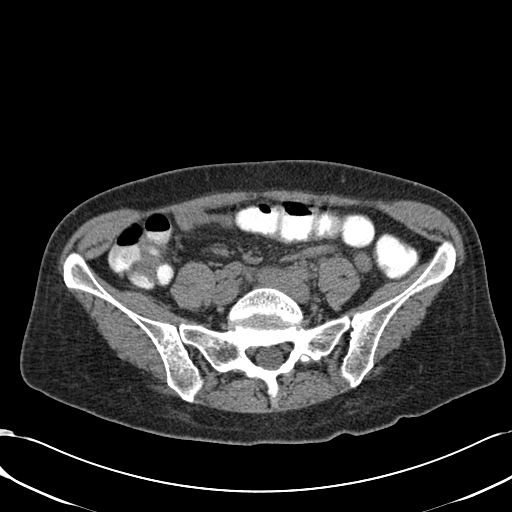
[im 117/126  soft-tissue]
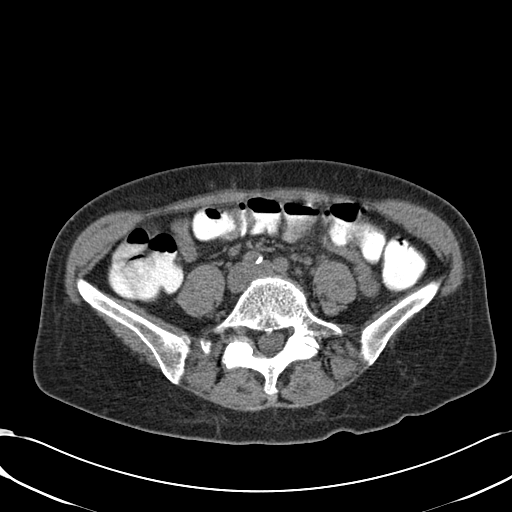

[16 of 46 positions shown; findings below may reference images not displayed]

FINDINGS: There again noted changes consistent with acute diverticulitis in
the sigmoid colon with wall thickening and evidence of pericolonic
inflammatory change. Additionally there is passage of contrast
material from 1 of the diverticula inferiorly to the vagina with
pooling of contrast material in the vagina. No passage of contrast
or air is noted into the urinary bladder. No other focal abnormality
is noted.
IMPRESSION: Changes consistent with acute sigmoid diverticulitis with rupture of
a diverticulum and a colovaginal fistula with passage of contrast
material into the vaginal vault.

## 2015-01-18 NOTE — Op Note (Signed)
PATIENT NAME:  Hannah Hurst, Hannah Hurst MR#:  [id number] DATE OF BIRTH:  September 14, 1961  DATE OF PROCEDURE:  09/06/2014  PREOPERATIVE DIAGNOSIS: Diverticulitis with colovaginal fistula.   POSTOPERATIVE DIAGNOSIS:  Diverticulitis with colovaginal fistula.   OPERATION:   1.  Exploratory laparotomy.  2.  Sigmoid resection with primary anastomosis.  3.  Diverting proximal ileostomy.   SURGEON: Micheline Maze, MD.    ANESTHESIA: General.   ASSISTANT: Nestor Lewandowsky, MD.   OPERATIVE PROCEDURE: With the patient in the supine position after the induction of appropriate general anesthesia and placement of a Foley catheter the patient's abdomen was prepped with Chloraprep and draped with sterile towels. Betadine-impregnated Steri-Drape was utilized. A midline incision was made from just above the umbilicus to the suprapubic area and carried down through the subcutaneous tissue with Bovie electrocautery. Midline fascia was identified and opened the length of the skin incision as was the peritoneum. The abdomen was examined. There was a large inflammatory mass adherent to the pelvic floor. There did appear to be a significant amount of redundant colon below the area of adhesion. Careful dissection with blunt and Bovie technique was able to separate the bowel from the pelvic floor and the left pelvic side wall. Left ureter was identified and preserved. The defect in the bowel and in the cervical stump did appear to be evident quite easily. The bowel was divided on each side of the inflamed area using the Contour stapling device and the length of the mesentery was taken down with the LigaSure.  The specimen passed off the table. The 2 ends of bowel were placed side by side and appeared to be appropriately loose with no evidence of any tension or torsion. Two layer anastomosis was performed by hand using a posterior row of seromuscular silk sutures, opening the bowel along the staple line, doing a running suture of 3-0 Vicryl and  then an anterior layer with 3-0 silk in a seromuscular fashion. The repair appeared to be satisfactory. Evicel gel was placed over the defect in the uterine stump after closing the defect with 0 Vicryl. Evicel was also placed around the anastomosis. Omentum was then placed down into the pelvis to separate the 2 pieces of tissue. Because of the severity of her inflammatory change and the proximity of the anastomosis to the previous defect I elected to perform a diverting ileostomy. An area was chosen in the right lower quadrant for an ileostomy. The skin incision was made, carried down through the subcutaneous tissue. A cruciate incision was made into the fascia and a 2 finger blunt incision made into the abdominal cavity. The identified piece of small bowel approximately 12-14 inches from the ileocecal valve was elevated into the incision and a bridge placed underneath the mesentery to keep the bowel in place. The abdomen had been previously irrigated prior to placing the Evicel.  The midline fascia was closed with a running suture of looped 0 PDS tying in the middle and burying the knot. A Penrose drain was placed in the depths of the incision and the incision stapled closed. The ostomy was then matured and a bag applied. The patient was returned to the recovery room, having tolerated the procedure well. Sponge and needle counts were correct x 2 in the operating room.    ____________________________ Micheline Maze, MD rle:bu D: 09/06/2014 11:54:05 ET T: 09/06/2014 18:10:46 ET JOB#: [id number]  cc: Micheline Maze, MD, <Dictator> Ocie Cornfield. Ouida Sills, MD Rodena Goldmann MD ELECTRONICALLY  SIGNED 09/07/2014 9:38

## 2015-01-18 NOTE — H&P (Signed)
   Subjective/Chief Complaint LLQ pain x 1 day, nausea/vomiting/bloating x 3 days   History of Present Illness Hannah Hurst is a pleasant 54 yo F with a history of pancreatitis and recent right knee replacement on 11/11 who presents with 1 day of LLQ pain.  Comes in waves, sharp, worsening, cramping.  Also with N/V/Fevers/chills x 3 days.  Has felt constipated since knee surgery and has been attributing this feeling to pain meds and improper bowel regimen.  Doing well prior to this.  No sick contacts, no unusual ingestions.   Past History H/o gallstone pancreatitis s/p cholecystectomy H/o arthritis H/o knee replacement H/o hysterectomy H/o anemia   Code Status Full Code   Past Med/Surgical Hx:  genital warts:   arthritis:   anemia:   gall bladder removal:   vaginal repairs:   total hysterectomy:   ALLERGIES:  No Known Allergies:   Family and Social History:  Family History Non-Contributory   Social History negative tobacco, negative ETOH   Review of Systems:  Subjective/Chief Complaint LLQ pain, N/V/F/C   Fever/Chills Yes   Cough No   Sputum No   Abdominal Pain Yes   Diarrhea No   Constipation Yes   Nausea/Vomiting Yes   SOB/DOE No   Chest Pain No   Dysuria No   Tolerating Diet No  Nauseated  Vomiting   Physical Exam:  GEN well developed, well nourished, no acute distress   HEENT pink conjunctivae, PERRL, hearing intact to voice, good dentition   RESP normal resp effort  clear BS  no use of accessory muscles   CARD regular rate  no murmur  No LE edema   ABD positive tenderness  no hernia  soft  normal BS  no Adominal Mass   EXTR negative cyanosis/clubbing, negative edema   SKIN normal to palpation, No rashes, No ulcers   NEURO cranial nerves intact, negative rigidity, negative tremor, follows commands, strength:, motor/sensory function intact   PSYCH A+O to time, place, person, good insight    Assessment/Admission Diagnosis LLQ pain, N/V/F/C.   WBC 14.  CT shows diverticulitis with small peridiverticular abscess.  Hemodynamically stable.   Plan Admit.  NPO.  IVF.  IV antibiotics.  No indication for emergent surgical intervention at this time.   Electronic Signatures: Floyde Parkins (MD)  (Signed 07-Dec-15 20:48)  Authored: CHIEF COMPLAINT and HISTORY, PAST MEDICAL/SURGIAL HISTORY, ALLERGIES, FAMILY AND SOCIAL HISTORY, REVIEW OF SYSTEMS, PHYSICAL EXAM, ASSESSMENT AND PLAN   Last Updated: 07-Dec-15 20:48 by Floyde Parkins (MD)

## 2015-01-18 NOTE — H&P (Signed)
   Subjective/Chief Complaint Not feeling well.  High ostomy output.  Nausea.   History of Present Illness 54 yo s/p sigmoid colectomy with diverting loop ileostomy on Dec 11.  Presents with multiple complaints including nausea, malaise, high ostomy output despite lomotil.  Also with concerns with wound drainage.  Difficulty with solids.  + subjective chills.  No chest pain, shortness of breath, cough, dysuria/hematuria.   Past History S/p sigmoid colectomy with diverting loop ileostomy 08/2014 H/o cholecystectomy H/o arthritis H/o knee replacement H/o hysterectomy H/o anemia   Code Status Full Code   Past Med/Surgical Hx:  genital warts:   arthritis:   anemia:   Diverting proximal ileostomy:   Diverticulitis with colovaginal fistula.:   gall bladder removal:   vaginal repairs:   total hysterectomy:   ALLERGIES:  No Known Allergies:   Family and Social History:  Family History Non-Contributory   Social History negative tobacco, negative ETOH   Place of Living Home   Review of Systems:  Subjective/Chief Complaint Nausea, malaise, high ostomy output   Fever/Chills Yes   Cough No   Sputum No   Abdominal Pain No   Diarrhea Yes   Constipation No   Nausea/Vomiting No   SOB/DOE No   Dysuria No   Tolerating Diet No  Nauseated   Physical Exam:  GEN well developed, well nourished, no acute distress   HEENT pink conjunctivae, PERRL, hearing intact to voice, good dentition   RESP normal resp effort  clear BS  no use of accessory muscles   CARD regular rate  no murmur  no thrills   ABD denies tenderness  no hernia  soft  normal BS  Incision c/d/i, ostomy pink   EXTR negative cyanosis/clubbing, negative edema   SKIN normal to palpation, skin turgor good   NEURO cranial nerves intact, negative tremor, follows commands, strength:, motor/sensory function intact   PSYCH A+O to time, place, person, good insight    Assessment/Admission Diagnosis 54 yo with  multiple medical complaints.  I feel that there is some component of dehydration based on ostomy output and hypokalemia.   Plan Admit, rehydrate. Regular diet.  Watch ostomy output, may require addition of lomotil to decrease to approx 1026ml/day   Electronic Signatures: Kelis Plasse, Glena Norfolk (MD)  (Signed 30-Dec-15 16:32)  Authored: CHIEF COMPLAINT and HISTORY, PAST MEDICAL/SURGIAL HISTORY, ALLERGIES, FAMILY AND SOCIAL HISTORY, REVIEW OF SYSTEMS, PHYSICAL EXAM, ASSESSMENT AND PLAN   Last Updated: 30-Dec-15 16:32 by Floyde Parkins (MD)

## 2015-01-20 LAB — SURGICAL PATHOLOGY

## 2015-01-22 NOTE — Discharge Summary (Signed)
PATIENT NAME:  Hannah Hurst, Hannah Hurst MR#:  [id number] DATE OF BIRTH:  1961/09/12  DATE OF ADMISSION:  09/02/2014 DATE OF DISCHARGE:  09/13/2014  BRIEF HISTORY: [name] is a 54 year old woman admitted through the Emergency Room for evaluation of possible perforated diverticulitis. She had a CT scan which demonstrated air-fluid outside her colon in the left lower quadrant near the sigmoid and vaginal stump following a previous hysterectomy. She was admitted for further evaluation and antibiotic therapy. She has been on intravenous antibiotics and she began to improve clinically; however, on December 8 she noted some drainage through her vagina. On the 9th she does noticed some stool and by the 10th was passing gas and stool through her vagina. CT scan was performed which revealed decompression of the sigmoid abscess through the vagina consistent with possible fistula. A barium CT scan with rectal contrast was performed demonstrating a significant fistula between the descending portion of the sigmoid colon and the vaginal cuff. After appropriate preoperative preparation and informed consent, she was taken to surgery on the morning 09/06/2014. She underwent a sigmoid resection, closure of the defect, biologic glue over the defect, and then a diverting proximal ileostomy. The procedure was uncomplicated. She had significant pain control issues following surgery, but continued to improve over the next several days. She was able tolerate a liquid diet initially, advanced to a soft diet without difficulty. She was discharged home on the 18th be followed in the office in 10-14 days.   DISCHARGE MEDICATIONS: Include aspirin 300 mg p.o. daily, Robaxin 500 mg every 6 hours p.r.n., morphine 100 mg/12 hour capsule 1 every 12 hours, morphine 15 mg short release every 2 hours p.r.n., loperamide 2 mg every 4 hours, Diflucan 100 mg once a day, Valium 5 mg every 6 hours p.r.n.   FINAL DISCHARGE DIAGNOSIS: Perforated diverticulitis  with colovaginal fistula.   SURGERY: Exploratory laparotomy, sigmoid resection, closure of the vaginal stump, diverting ileostomy.    ____________________________ Micheline Maze, MD rle:bm D: 09/18/2014 22:53:43 ET T: 09/19/2014 02:55:24 ET JOB#: [id number]  cc: Micheline Maze, MD, <Dictator> Ocie Cornfield. Ouida Sills, MD Rodena Goldmann MD ELECTRONICALLY SIGNED 09/27/2014 23:06

## 2015-01-26 NOTE — Op Note (Signed)
PATIENT NAME:  Hannah Hurst, Hannah Hurst MR#:  [id number] DATE OF BIRTH:  March 04, 1961  DATE OF PROCEDURE:  10/31/2014  PREOPERATIVE DIAGNOSIS:  Ileostomy, status post colovesical fistula.   POSTOPERATIVE DIAGNOSIS:  Ileostomy, status post colovesical fistula.   POSTOPERATIVE DIAGNOSIS: Chronic cholecystitis and cholelithiasis.   OPERATION: Ileostomy closure.   SURGEON: Micheline Maze, MD.   Terrence DupontWynetta Emery, [employer] student.   ANESTHESIA: General.   OPERATIVE PROCEDURE: With the patient in the supine position after induction of appropriate general anesthesia her abdomen was prepped with Betadine and draped with sterile towels. The ostomy had previously been sewn closed. An alcohol wipe and Betadine impregnated Steri-Drape were utilized. An elliptical incision was made around the ostomy, carried down through the subcutaneous tissue with Bovie electrocautery. The ostomy was separated from the fascia and intraperitoneal position confirmed. The bowel was elevated into the incision. Two limbs of the bowel were divided using the GIA-55 stapling device and the mesentery taken down with ligatures of 3-0 Vicryl. The specimen was passed off the table. The 2 loops of bowel were placed side by side. Posterior row of sutures using 3-0 silk were placed in a seromuscular fashion. The staple lines were amputated and a mucosal layer of 3-0 Vicryl were placed in a running fashion. The anterior layer was then closed with seromuscular sutures of 3-0 silk. The mesenteric defect was obliterated with 3-0 Vicryl suture. Bowel content returned to their anatomic position. The area was copiously irrigated. Posterior fascia was closed with a running suture of 0 Maxon as was the anterior fascia. The area was irrigated, drain placed in subcutaneous space and the skin clipped. Sterile dressing was applied. The patient was returned to the recovery room having tolerated the procedure well. Sponge, instrument, and needle counts were correct x 2 in  the operating room.    ____________________________ Micheline Maze, MD rle:bu D: 10/31/2014 16:19:08 ET T: 10/31/2014 20:13:19 ET JOB#: [id number]  cc: Micheline Maze, MD, <Dictator> Ocie Cornfield. Ouida Sills, MD Rodena Goldmann MD ELECTRONICALLY SIGNED 11/02/2014 22:29

## 2015-01-26 NOTE — Discharge Summary (Signed)
PATIENT NAME:  Hannah Hurst, Hannah Hurst MR#:  [id number] DATE OF BIRTH:  08/10/1961  DATE OF ADMISSION:  10/31/2014 DATE OF DISCHARGE:  11/04/2014  BRIEF HISTORY: Hannah Hurst is a 54 year old woman who recently underwent laparotomy and bowel resection for a colovaginal fistula. She did well post surgery, but because of the severity of her infection and the proximity of the anastomosis to her vaginal stump, we elected to perform a proximal ileostomy for protection of the anastomosis. She did well with that procedure and now returns for ileostomy closure. After appropriate preoperative preparation and informed consent, she was taken to surgery on the morning of 10/31/2014, where she underwent ileostomy closure. The procedure was uncomplicated. She had no significant intraoperative or postoperative problems. Up, active, tolerating a regular diet. She was discharged home on the 7th, to be followed in the office in 7-10 days' time. Bathing, activity, and driving instructions were given to the patient.   DISCHARGE MEDICATIONS: She is to resume her home her medications of Robaxin 500 mg every 6 hours p.r.n., Norco 5/325 every 6 hours p.r.n., morphine 15 mg every 2 hours p.r.n., and morphine extended-release 30 mg every 12 hours.   FINAL DISCHARGE DIAGNOSIS: Colovaginal fistula with procedure of reversal of ileostomy.    ____________________________ Rodena Goldmann III, MD rle:mw D: 11/16/2014 14:46:00 ET T: 11/16/2014 14:58:50 ET JOB#: [id number]  cc: Rodena Goldmann III, MD, <Dictator> Rodena Goldmann MD ELECTRONICALLY SIGNED 11/20/2014 8:16

## 2016-07-07 DIAGNOSIS — Z8544 Personal history of malignant neoplasm of other female genital organs: Secondary | ICD-10-CM | POA: Insufficient documentation

## 2016-07-07 DIAGNOSIS — L932 Other local lupus erythematosus: Secondary | ICD-10-CM | POA: Insufficient documentation

## 2016-07-07 DIAGNOSIS — F988 Other specified behavioral and emotional disorders with onset usually occurring in childhood and adolescence: Secondary | ICD-10-CM | POA: Insufficient documentation

## 2016-07-07 DIAGNOSIS — F325 Major depressive disorder, single episode, in full remission: Secondary | ICD-10-CM | POA: Insufficient documentation

## 2016-07-07 DIAGNOSIS — E7800 Pure hypercholesterolemia, unspecified: Secondary | ICD-10-CM | POA: Insufficient documentation

## 2016-07-07 DIAGNOSIS — K279 Peptic ulcer, site unspecified, unspecified as acute or chronic, without hemorrhage or perforation: Secondary | ICD-10-CM | POA: Insufficient documentation

## 2016-07-07 DIAGNOSIS — R5382 Chronic fatigue, unspecified: Secondary | ICD-10-CM | POA: Insufficient documentation

## 2016-08-25 DIAGNOSIS — M2550 Pain in unspecified joint: Secondary | ICD-10-CM | POA: Insufficient documentation

## 2016-08-25 DIAGNOSIS — Z79899 Other long term (current) drug therapy: Secondary | ICD-10-CM | POA: Insufficient documentation

## 2016-09-17 ENCOUNTER — Ambulatory Visit: Payer: Self-pay | Admitting: Unknown Physician Specialty

## 2017-03-16 ENCOUNTER — Encounter: Payer: Self-pay | Admitting: Obstetrics and Gynecology

## 2018-09-24 ENCOUNTER — Emergency Department: Payer: BLUE CROSS/BLUE SHIELD

## 2018-09-24 ENCOUNTER — Other Ambulatory Visit: Payer: Self-pay

## 2018-09-24 ENCOUNTER — Emergency Department
Admission: EM | Admit: 2018-09-24 | Discharge: 2018-09-24 | Disposition: A | Discharge status: Home / Self Care | Payer: BLUE CROSS/BLUE SHIELD | Attending: Emergency Medicine | Admitting: Emergency Medicine

## 2018-09-24 DIAGNOSIS — Y998 Other external cause status: Secondary | ICD-10-CM | POA: Diagnosis not present

## 2018-09-24 DIAGNOSIS — S0990XA Unspecified injury of head, initial encounter: Secondary | ICD-10-CM | POA: Diagnosis present

## 2018-09-24 DIAGNOSIS — Y929 Unspecified place or not applicable: Secondary | ICD-10-CM | POA: Insufficient documentation

## 2018-09-24 DIAGNOSIS — Z96651 Presence of right artificial knee joint: Secondary | ICD-10-CM | POA: Diagnosis not present

## 2018-09-24 DIAGNOSIS — F17210 Nicotine dependence, cigarettes, uncomplicated: Secondary | ICD-10-CM | POA: Insufficient documentation

## 2018-09-24 DIAGNOSIS — Z7982 Long term (current) use of aspirin: Secondary | ICD-10-CM | POA: Diagnosis not present

## 2018-09-24 DIAGNOSIS — Z79899 Other long term (current) drug therapy: Secondary | ICD-10-CM | POA: Diagnosis not present

## 2018-09-24 DIAGNOSIS — S0083XA Contusion of other part of head, initial encounter: Secondary | ICD-10-CM | POA: Insufficient documentation

## 2018-09-24 DIAGNOSIS — Y9389 Activity, other specified: Secondary | ICD-10-CM | POA: Insufficient documentation

## 2018-09-24 MED ORDER — KETOROLAC TROMETHAMINE 10 MG PO TABS
10.0000 mg | ORAL_TABLET | Freq: Once | ORAL | Status: AC
  Administered 2018-09-24: 10 mg via ORAL
  Filled 2018-09-24: qty 1

## 2018-09-24 NOTE — ED Notes (Signed)
Spoke with Dr. Beather Arbour regarding patient, orders received.
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
## 2020-07-28 ENCOUNTER — Ambulatory Visit: Payer: Self-pay

## 2021-11-24 DIAGNOSIS — D225 Melanocytic nevi of trunk: Secondary | ICD-10-CM | POA: Diagnosis not present

## 2021-11-24 DIAGNOSIS — X32XXXA Exposure to sunlight, initial encounter: Secondary | ICD-10-CM | POA: Diagnosis not present

## 2021-11-24 DIAGNOSIS — D2261 Melanocytic nevi of right upper limb, including shoulder: Secondary | ICD-10-CM | POA: Diagnosis not present

## 2021-11-24 DIAGNOSIS — Z85828 Personal history of other malignant neoplasm of skin: Secondary | ICD-10-CM | POA: Diagnosis not present

## 2021-11-24 DIAGNOSIS — D2262 Melanocytic nevi of left upper limb, including shoulder: Secondary | ICD-10-CM | POA: Diagnosis not present

## 2021-11-24 DIAGNOSIS — L570 Actinic keratosis: Secondary | ICD-10-CM | POA: Diagnosis not present

## 2021-12-15 DIAGNOSIS — F411 Generalized anxiety disorder: Secondary | ICD-10-CM | POA: Diagnosis not present

## 2021-12-15 DIAGNOSIS — F330 Major depressive disorder, recurrent, mild: Secondary | ICD-10-CM | POA: Diagnosis not present

## 2021-12-15 DIAGNOSIS — F900 Attention-deficit hyperactivity disorder, predominantly inattentive type: Secondary | ICD-10-CM | POA: Diagnosis not present

## 2022-03-13 ENCOUNTER — Other Ambulatory Visit: Payer: Self-pay

## 2022-03-13 ENCOUNTER — Emergency Department
Admission: EM | Admit: 2022-03-13 | Discharge: 2022-03-13 | Disposition: A | Discharge status: Home / Self Care | Payer: BC Managed Care – PPO | Attending: Emergency Medicine | Admitting: Emergency Medicine

## 2022-03-13 ENCOUNTER — Emergency Department: Payer: BC Managed Care – PPO

## 2022-03-13 DIAGNOSIS — S060X9A Concussion with loss of consciousness of unspecified duration, initial encounter: Secondary | ICD-10-CM | POA: Diagnosis not present

## 2022-03-13 DIAGNOSIS — Z859 Personal history of malignant neoplasm, unspecified: Secondary | ICD-10-CM | POA: Insufficient documentation

## 2022-03-13 DIAGNOSIS — Z720 Tobacco use: Secondary | ICD-10-CM

## 2022-03-13 DIAGNOSIS — R42 Dizziness and giddiness: Secondary | ICD-10-CM | POA: Diagnosis not present

## 2022-03-13 DIAGNOSIS — Z96651 Presence of right artificial knee joint: Secondary | ICD-10-CM | POA: Insufficient documentation

## 2022-03-13 DIAGNOSIS — S7012XA Contusion of left thigh, initial encounter: Secondary | ICD-10-CM | POA: Diagnosis not present

## 2022-03-13 DIAGNOSIS — R042 Hemoptysis: Secondary | ICD-10-CM | POA: Insufficient documentation

## 2022-03-13 DIAGNOSIS — W548XXA Other contact with dog, initial encounter: Secondary | ICD-10-CM | POA: Diagnosis not present

## 2022-03-13 DIAGNOSIS — S40212A Abrasion of left shoulder, initial encounter: Secondary | ICD-10-CM | POA: Diagnosis not present

## 2022-03-13 DIAGNOSIS — Z20822 Contact with and (suspected) exposure to covid-19: Secondary | ICD-10-CM | POA: Diagnosis not present

## 2022-03-13 DIAGNOSIS — R9431 Abnormal electrocardiogram [ECG] [EKG]: Secondary | ICD-10-CM | POA: Diagnosis not present

## 2022-03-13 DIAGNOSIS — S0990XA Unspecified injury of head, initial encounter: Secondary | ICD-10-CM | POA: Diagnosis not present

## 2022-03-13 HISTORY — DX: Reserved for concepts with insufficient information to code with codable children: IMO0002

## 2022-03-13 HISTORY — DX: Systemic lupus erythematosus, unspecified: M32.9

## 2022-03-13 HISTORY — DX: Malignant (primary) neoplasm, unspecified: C80.1

## 2022-03-13 LAB — COMPREHENSIVE METABOLIC PANEL
ALT: 24 U/L (ref 0–44)
AST: 29 U/L (ref 15–41)
Albumin: 4.5 g/dL (ref 3.5–5.0)
Alkaline Phosphatase: 82 U/L (ref 38–126)
Anion gap: 9 (ref 5–15)
BUN: 18 mg/dL (ref 6–20)
CO2: 27 mmol/L (ref 22–32)
Calcium: 9.6 mg/dL (ref 8.9–10.3)
Chloride: 106 mmol/L (ref 98–111)
Creatinine, Ser: 0.59 mg/dL (ref 0.44–1.00)
GFR, Estimated: >60 mL/min (ref >60)
Glucose, Bld: 107 mg/dL — ABNORMAL HIGH (ref 70–99)
Potassium: 4.4 mmol/L (ref 3.5–5.1)
Sodium: 142 mmol/L (ref 135–145)
Total Bilirubin: 0.8 mg/dL (ref 0.3–1.2)
Total Protein: 7.7 g/dL (ref 6.5–8.1)

## 2022-03-13 LAB — URINALYSIS, ROUTINE W REFLEX MICROSCOPIC
Bilirubin Urine: NEGATIVE
Glucose, UA: NEGATIVE mg/dL
Hgb urine dipstick: NEGATIVE
Ketones, ur: NEGATIVE mg/dL
Leukocytes,Ua: NEGATIVE
Nitrite: NEGATIVE
Protein, ur: NEGATIVE mg/dL
Specific Gravity, Urine: 1.018 (ref 1.005–1.030)
pH: 7 (ref 5.0–8.0)

## 2022-03-13 LAB — CBC
HCT: 40.2 % (ref 36.0–46.0)
Hemoglobin: 12.9 g/dL (ref 12.0–15.0)
MCH: 29.7 pg (ref 26.0–34.0)
MCHC: 32.1 g/dL (ref 30.0–36.0)
MCV: 92.4 fL (ref 80.0–100.0)
Platelets: 305 10*3/uL (ref 150–400)
RBC: 4.35 MIL/uL (ref 3.87–5.11)
RDW: 13.8 % (ref 11.5–15.5)
WBC: 6.9 10*3/uL (ref 4.0–10.5)
nRBC: 0 % (ref 0.0–0.2)

## 2022-03-13 LAB — SARS CORONAVIRUS 2 BY RT PCR: SARS Coronavirus 2 by RT PCR: NEGATIVE

## 2022-03-13 MED ORDER — AMOXICILLIN-POT CLAVULANATE 125-31.25 MG/5ML PO SUSR: 125.0000 mg | Freq: Two times a day (BID) | ORAL | 0 refills | Status: AC

## 2022-03-13 MED ORDER — IOHEXOL 300 MG/ML  SOLN
60.0000 mL | Freq: Once | INTRAMUSCULAR | Status: AC | PRN
  Administered 2022-03-13: 60 mL via INTRAVENOUS

## 2022-03-13 MED ORDER — DOXYCYCLINE HYCLATE 100 MG PO CAPS: 100.0000 mg | ORAL_CAPSULE | Freq: Two times a day (BID) | ORAL | 0 refills | Status: AC

## 2022-03-13 MED ORDER — BENZONATATE 100 MG PO CAPS
100.0000 mg | ORAL_CAPSULE | Freq: Once | ORAL | Status: AC
  Administered 2022-03-13: 100 mg via ORAL
  Filled 2022-03-13: qty 1

## 2022-03-13 MED ORDER — BENZONATATE 100 MG PO CAPS: 100.0000 mg | ORAL_CAPSULE | Freq: Three times a day (TID) | ORAL | 0 refills | Status: AC | PRN

## 2022-03-13 NOTE — Discharge Instructions (Addendum)
Your chest Ct today showed: IMPRESSION: 1. No pulmonary embolus. 2. Right upper and lower patchy ground-glass airspace opacities with a masslike 5.4 x 3.6 cm ground-glass opacity lesion within the right upper lobe. Finding could represent alveolar hemorrhage versus infection/inflammation. Underlying malignancy not excluded. Recommend follow-up CT in 3 months to evaluate for resolution.
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
## 2022-03-13 NOTE — ED Provider Notes (Addendum)
[facility] Provider Note    Event Date/Time   First MD Initiated Contact with Patient 03/13/22 1713     (approximate)   History   Hemoptysis   HPI  Hannah Hurst is a 61 y.o. female with a past medical history of arthritis,, anxiety, lupus as well as history of a cholecystectomy, nasal polyp surgery and a hysterectomy as well as ongoing tobacco abuse who presents for assessment of 2 concerns.  First patient states that about a week ago she was chasing her dog when she hit her head against the ground when she fell and has some persistent mild headache.  She also states she bruises top and posterior aspect of her left shoulder and the inside of her left leg.  She states has been walking without any significant difficulty and using her arms with only some mild pain in left shoulder.  She denies any right shoulder pain, elbow pain, wrist pain or any other extremity pain.  She does not think she passed out and does not take any blood thinners.  Second she states that yesterday she started coughing up bright red blood.  She denies any shortness of breath, chest pain, fevers, nausea, vomiting, diarrhea, urinary symptoms, back pain or abdominal pain.  No prior similar episodes.  No clear leaving aggravating factors.  She denies TH use illicit drug use.  She has been taking Tylenol ibuprofen.   Past Surgical History:  Procedure Laterality Date   ABDOMINAL HYSTERECTOMY     CHOLECYSTECTOMY     NASAL POLYP SURGERY     PANCREAS SURGERY     TOTAL KNEE ARTHROPLASTY Right 08/07/2014   dr Percell Miller   TOTAL KNEE ARTHROPLASTY Right 08/07/2014   Procedure: RIGHT TOTAL KNEE ARTHROPLASTY;  Surgeon: Ninetta Lights, MD;  Location: [place];  Service: Orthopedics;  Laterality: Right;       Past Medical History:  Diagnosis Date   Anxiety    adhd   Arthritis    PRIMARY GENERAL BILATERAL KNEES   Cancer ([place])    Family history of adverse reaction to anesthesia    MOTHER HAS NAUSEA    Lupus ([place])      Physical Exam  Triage Vital Signs: ED Triage Vitals  Enc Vitals Group     BP 03/13/22 1623 137/75     Pulse Rate 03/13/22 1623 79     Resp 03/13/22 1623 18     Temp 03/13/22 1623 98.8 F (37.1 C)     Temp Source 03/13/22 1623 Oral     SpO2 03/13/22 1623 96 %     Weight 03/13/22 1618 115 lb (52.2 kg)     Height 03/13/22 1618 '5\' 6"'$  (1.676 m)     Head Circumference --      Peak Flow --      Pain Score 03/13/22 1616 0     Pain Loc --      Pain Edu? --      Excl. in [place]? --     Most recent vital signs: Vitals:   03/13/22 1623  BP: 137/75  Pulse: 79  Resp: 18  Temp: 98.8 F (37.1 C)  SpO2: 96%    General: Awake, no distress.  CV:  Good peripheral perfusion.  2+ radial pulses. Resp:  Normal effort.  Clear bilaterally. Abd:  No distention.  Soft. Other:  Cranial nerves II through XII grossly intact.  No pronator drift.  No finger dysmetria.  Symmetric 5/5 strength of all extremities.  Sensation intact to light touch in all extremities.  Unremarkable unassisted gait.  There is a abrasion over the superior lateral aspect of the left shoulder./Ecchymosis over the medial left lower thigh.    ED Results / Procedures / Treatments  Labs (all labs ordered are listed, but only abnormal results are displayed) Labs Reviewed  URINALYSIS, ROUTINE W REFLEX MICROSCOPIC - Abnormal; Notable for the following components:      Result Value   Color, Urine YELLOW (*)    APPearance CLEAR (*)    All other components within normal limits  COMPREHENSIVE METABOLIC PANEL - Abnormal; Notable for the following components:   Glucose, Bld 107 (*)    All other components within normal limits  SARS CORONAVIRUS 2 BY RT PCR  CBC     EKG  ECG is remarkable sinus rhythm with somewhat low amplitude in lead I without any other clear evidence of acute ischemia or significant arrhythmia.   RADIOLOGY Chest reviewed by myself shows no focal consoidation, effusion, edema,  pneumothorax or other clear acute thoracic process. I also reviewed radiology interpretation and agree with findings described.  CT head on my interpretation without evidence of ischemia, edema, mass effect, hemorrhage or skull fracture.  I also reviewed radiology's interpretation and agree with the findings.  CTA chest my interpretation shows what appears to be a right upper lobe consolidation without evidence of PE, edema, pneumothorax, effusion or other clear acute process.  I reviewed radiology interpretation and agree with the findings of the right upper and lower lobe patchy groundglass opacities with a masslike consolidation 5.4 x 3.6 cm representing possible alveolar hemorrhage versus infection versus inflammatory process.  PROCEDURES:  Critical Care performed: No  Procedures    MEDICATIONS ORDERED IN ED: Medications  benzonatate (TESSALON) capsule 100 mg (100 mg Oral Given 03/13/22 1837)  iohexol (OMNIPAQUE) 300 MG/ML solution 60 mL (60 mLs Intravenous Contrast Given 03/13/22 1854)     IMPRESSION / MDM / ASSESSMENT AND PLAN / ED COURSE  I reviewed the triage vital signs and the nursing notes. Patient's presentation is most consistent with acute presentation with potential threat to life or bodily function.                               With regard to patient's fall last week considerations for her ongoing headache include possible occult skull fracture, intracranial hemorrhage versus concussive symptoms.  She is able to ambulate and does not have any trauma other than some ecchymosis and exam left lower extremity.  Addition there is an abrasion over the left posterior shoulder but overall I have low suspicion for significant orthopedic injury in the left upper extremity or left lower extremity.  CT head obtained is negative.  Suspect likely concussive symptoms with regard to her headache.  Discussed expected clinical course and importance of avoiding repeat head trauma and  importance of close outpatient follow-up.  With regard to her hemoptysis differential considerations include bronchitis, PE, malignancy, AVM, pneumonia.  Chest x-ray is unremarkable for evidence of focal consolidation or mass.  Obtain CTA given her history of lupus to assess for PE.  CTA chest my interpretation shows what appears to be a right upper lobe consolidation without evidence of PE, edema, pneumothorax, effusion or other clear acute process.  I reviewed radiology interpretation and agree with the findings of the right upper and lower lobe patchy groundglass opacities with a masslike consolidation 5.4 x 3.6 cm  representing possible alveolar hemorrhage versus infection versus inflammatory process.  CMP shows no significant electrolyte or metabolic derangements.  CBC without leukocytosis or acute anemia and normal platelets.  UA is unremarkable.  COVID PCR is negative.  Concern for possible malignancy although we will treat with a course of antibiotics for possible commune acquired pneumonia.  I discussed with on-call pulmonologist Dr. Jason Nest who will help arrange close outpatient follow-up and patient calls clinic Monday morning.  She is only had scant amount of blood-tinged sputum and tissue over 3/2 hours emergency room stable vitals and no evidence of respiratory failure and a stable hemoglobin at this point think she is stable for discharge with outpatient follow-up.  Discussed returning to emergency room if she has any significant increase in her hemoptysis or any new or worsening of her symptoms.  She is agreeable to plan.  Rx written for Tessalon.  Discharged in stable condition.  Strict return precautions advised and discussed.        FINAL CLINICAL IMPRESSION(S) / ED DIAGNOSES   Final diagnoses:  Hemoptysis  Concussion with loss of consciousness, initial encounter  Tobacco abuse     Rx / DC Orders   ED Discharge Orders          Ordered    amoxicillin-clavulanate  (AUGMENTIN) 125-31.25 MG/5ML suspension  2 times daily        03/13/22 1930    doxycycline (VIBRAMYCIN) 100 MG capsule  2 times daily        03/13/22 1930    benzonatate (TESSALON PERLES) 100 MG capsule  3 times daily PRN        03/13/22 1930             Note:  This document was prepared using Dragon voice recognition software and may include unintentional dictation errors.   Lucrezia Starch, MD 03/13/22 Lattie Corns    Lucrezia Starch, MD 03/13/22 [phone]

## 2022-03-13 NOTE — ED Notes (Signed)
Pt presents to ED with hemoptysis. Pt states that it started this morning and went away on the way here and then started back up when pt arrived to hospital. Pt c/o dizziness and light headedness.

## 2022-03-13 NOTE — ED Notes (Signed)
Pt verbalized understanding of discharge instructions, prescriptions, and follow-up care instructions. Pt advised if symptoms worsen to return to ED.  

## 2022-03-15 ENCOUNTER — Telehealth: Payer: Self-pay

## 2022-03-15 NOTE — Telephone Encounter (Signed)
Left a voicemail asking patient to contact our office to schedule a NP Pulmonary appointment.

## 2022-04-08 ENCOUNTER — Ambulatory Visit: Payer: BC Managed Care – PPO | Admitting: Internal Medicine

## 2022-04-08 ENCOUNTER — Encounter: Payer: Self-pay | Admitting: Internal Medicine

## 2022-04-08 VITALS — BP 116/94 | HR 85 | Temp 98.5°F | Resp 16 | Ht 66.0 in | Wt 122.2 lb

## 2022-04-08 DIAGNOSIS — F900 Attention-deficit hyperactivity disorder, predominantly inattentive type: Secondary | ICD-10-CM | POA: Diagnosis not present

## 2022-04-08 DIAGNOSIS — F17210 Nicotine dependence, cigarettes, uncomplicated: Secondary | ICD-10-CM

## 2022-04-08 DIAGNOSIS — F330 Major depressive disorder, recurrent, mild: Secondary | ICD-10-CM | POA: Diagnosis not present

## 2022-04-08 DIAGNOSIS — M069 Rheumatoid arthritis, unspecified: Secondary | ICD-10-CM | POA: Diagnosis not present

## 2022-04-08 DIAGNOSIS — F411 Generalized anxiety disorder: Secondary | ICD-10-CM | POA: Diagnosis not present

## 2022-04-08 DIAGNOSIS — M329 Systemic lupus erythematosus, unspecified: Secondary | ICD-10-CM

## 2022-04-08 DIAGNOSIS — R0602 Shortness of breath: Secondary | ICD-10-CM

## 2022-04-08 DIAGNOSIS — R042 Hemoptysis: Secondary | ICD-10-CM

## 2022-04-08 NOTE — Progress Notes (Signed)
[facility] [place], [place] [address]  Pulmonary Sleep Medicine   Office Visit Note  Patient Name: Hannah Hurst DOB: 11-Mar-1961 MRN [id number]  Date of Service: 04/08/2022  Complaints/HPI: She had hemoptysis on 6/17 she states it was on and off and so she went to the ED. She had a CT chest done negative for PE. She did have GGO in the RUL area possible infection. She was given abx and sent home with amoxicilin doxycycline and pearls. She states the hemoptysis has gone away and she states this took 2 days to resolve. She has been feeling no SOB at this time. She deneis having CP. She does have a history of RA and Lupus. She was diagnosed with lupus RA in 2018. She does not have history of vasculitis in the past. No blood in stools noted. Stool color is normal. She had an ostomy in the past. This was diverticulitis in the past. This has resolved. She does smoke and is interested in stopping. Because of her Lupus she is not able to tolerate the patch and gum  ROS  General: (-) fever, (-) chills, (-) night sweats, (-) weakness Skin: (-) rashes, (-) itching,. Eyes: (-) visual changes, (-) redness, (-) itching. Nose and Sinuses: (-) nasal stuffiness or itchiness, (-) postnasal drip, (-) nosebleeds, (-) sinus trouble. Mouth and Throat: (-) sore throat, (-) hoarseness. Neck: (-) swollen glands, (-) enlarged thyroid, (-) neck pain. Respiratory: - cough, (-) bloody sputum, - shortness of breath, - wheezing. Cardiovascular: - ankle swelling, (-) chest pain. Lymphatic: (-) lymph node enlargement. Neurologic: (-) numbness, (-) tingling. Psychiatric: (-) anxiety, (-) depression   Current Medication: No outpatient encounter medications on file as of 04/08/2022.   No facility-administered encounter medications on file as of 04/08/2022.    Surgical History: Past Surgical History:  Procedure Laterality Date   ABDOMINAL HYSTERECTOMY     CHOLECYSTECTOMY     ilestomy  reversal  2016   NASAL POLYP SURGERY     PANCREAS SURGERY     TOTAL KNEE ARTHROPLASTY Right 08/07/2014   dr Percell Miller   TOTAL KNEE ARTHROPLASTY Right 08/07/2014   Procedure: RIGHT TOTAL KNEE ARTHROPLASTY;  Surgeon: Ninetta Lights, MD;  Location: [facility];  Service: Orthopedics;  Laterality: Right;    Medical History: Past Medical History:  Diagnosis Date   Anxiety    adhd   Arthritis    PRIMARY GENERAL BILATERAL KNEES   Cancer ([place])    Family history of adverse reaction to anesthesia    MOTHER HAS NAUSEA   History of reversal of ileostomy 2016   Ileostomy in place ([place]) 2015   Lupus ([place])     Family History: Family History  Problem Relation Age of Onset   Varicose Veins Mother    Kidney disease Mother    Heart disease Mother    Diabetes Mother    Depression Mother    COPD Mother    Arthritis Mother    Stroke Father    Heart disease Father    Hypertension Father    Alzheimer's disease Father    Arthritis Sister    Hyperlipidemia Sister    Varicose Veins Sister    Arthritis Brother    Hypertension Brother    Hyperlipidemia Brother    Arthritis Son    Arthritis Maternal Aunt    COPD Maternal Aunt    Hyperlipidemia Maternal Aunt    Heart disease Maternal Aunt    Stroke Maternal Aunt  Lupus Maternal Aunt    Alzheimer's disease Paternal Grandmother    Alcohol abuse Paternal Grandfather     Social History: Social History   Socioeconomic History   Marital status: Divorced    Spouse name: Not on file   Number of children: Not on file   Years of education: Not on file   Highest education level: Not on file  Occupational History   Not on file  Tobacco Use   Smoking status: Every Day    Packs/day: 0.50    Years: 20.00    Total pack years: 10.00    Types: Cigarettes   Smokeless tobacco: Never  Substance and Sexual Activity   Alcohol use: Yes    Comment: social   Drug use: No   Sexual activity: Not on file  Other Topics Concern   Not on file  Social  History Narrative   Not on file   Social Determinants of Health   Financial Resource Strain: Not on file  Food Insecurity: Not on file  Transportation Needs: Not on file  Physical Activity: Not on file  Stress: Not on file  Social Connections: Not on file  Intimate Partner Violence: Not on file    Vital Signs: Blood pressure (!) 116/94, pulse 85, temperature 98.5 F (36.9 C), resp. rate 16, height '5\' 6"'$  (1.676 m), weight 122 lb 3.2 oz (55.4 kg), SpO2 98 %.  Examination: General Appearance: The patient is well-developed, well-nourished, and in no distress. Skin: Gross inspection of skin unremarkable. Head: normocephalic, no gross deformities. Eyes: no gross deformities noted. ENT: ears appear grossly normal no exudates. Neck: Supple. No thyromegaly. No LAD. Respiratory: no rhonchi noted. Cardiovascular: Normal S1 and S2 without murmur or rub. Extremities: No cyanosis. pulses are equal. Neurologic: Alert and oriented. No involuntary movements.  LABS: Recent Results (from the past 2160 hour(s))  CBC     Status: None   Collection Time: 03/13/22  4:30 PM  Result Value Ref Range   WBC 6.9 4.0 - 10.5 K/uL   RBC 4.35 3.87 - 5.11 MIL/uL   Hemoglobin 12.9 12.0 - 15.0 g/dL   HCT 40.2 36.0 - 46.0 %   MCV 92.4 80.0 - 100.0 fL   MCH 29.7 26.0 - 34.0 pg   MCHC 32.1 30.0 - 36.0 g/dL   RDW 13.8 11.5 - 15.5 %   Platelets 305 150 - 400 K/uL   nRBC 0.0 0.0 - 0.2 %    Comment: Performed at [facility], [address] [place], [place] [address]  Comprehensive metabolic panel     Status: Abnormal   Collection Time: 03/13/22  4:30 PM  Result Value Ref Range   Sodium 142 135 - 145 mmol/L   Potassium 4.4 3.5 - 5.1 mmol/L   Chloride 106 98 - 111 mmol/L   CO2 27 22 - 32 mmol/L   Glucose, Bld 107 (H) 70 - 99 mg/dL    Comment: Glucose reference range applies only to samples taken after fasting for at least 8 hours.   BUN 18 6 - 20 mg/dL   Creatinine, Ser 0.59 0.44 - 1.00 mg/dL    Calcium 9.6 8.9 - 10.3 mg/dL   Total Protein 7.7 6.5 - 8.1 g/dL   Albumin 4.5 3.5 - 5.0 g/dL   AST 29 15 - 41 U/L   ALT 24 0 - 44 U/L   Alkaline Phosphatase 82 38 - 126 U/L   Total Bilirubin 0.8 0.3 - 1.2 mg/dL   GFR, Estimated >60 >  60 mL/min    Comment: (NOTE) Calculated using the CKD-EPI Creatinine Equation (2021)    Anion gap 9 5 - 15    Comment: Performed at [facility], [address] [place], [place] [address]  Urinalysis, Routine w reflex microscopic     Status: Abnormal   Collection Time: 03/13/22  4:32 PM  Result Value Ref Range   Color, Urine YELLOW (A) YELLOW   APPearance CLEAR (A) CLEAR   Specific Gravity, Urine 1.018 1.005 - 1.030   pH 7.0 5.0 - 8.0   Glucose, UA NEGATIVE NEGATIVE mg/dL   Hgb urine dipstick NEGATIVE NEGATIVE   Bilirubin Urine NEGATIVE NEGATIVE   Ketones, ur NEGATIVE NEGATIVE mg/dL   Protein, ur NEGATIVE NEGATIVE mg/dL   Nitrite NEGATIVE NEGATIVE   Leukocytes,Ua NEGATIVE NEGATIVE    Comment: Performed at [facility], [address] [place], [place] [address]  SARS Coronavirus 2 by RT PCR (hospital order, performed in [facility] hospital lab) *cepheid single result test* Anterior Nasal Swab     Status: None   Collection Time: 03/13/22  6:46 PM   Specimen: Anterior Nasal Swab  Result Value Ref Range   SARS Coronavirus 2 by RT PCR NEGATIVE NEGATIVE    Comment: (NOTE) SARS-CoV-2 target nucleic acids are NOT DETECTED.  The SARS-CoV-2 RNA is generally detectable in upper and lower respiratory specimens during the acute phase of infection. The lowest concentration of SARS-CoV-2 viral copies this assay can detect is 250 copies / mL. A negative result does not preclude SARS-CoV-2 infection and should not be used as the sole basis for treatment or other patient management decisions.  A negative result may occur with improper specimen collection / handling, submission of specimen other than nasopharyngeal swab, presence of viral  mutation(s) within the areas targeted by this assay, and inadequate number of viral copies (<250 copies / mL). A negative result must be combined with clinical observations, patient history, and epidemiological information.  Fact Sheet for Patients:   https://www.patel.info/  Fact Sheet for Healthcare Providers: https://hall.com/  This test is not yet approved or  cleared by the [place] FDA and has been authorized for detection and/or diagnosis of SARS-CoV-2 by FDA under an Emergency Use Authorization (EUA).  This EUA will remain in effect (meaning this test can be used) for the duration of the COVID-19 declaration under Section 564(b)(1) of the Act, 21 U.S.C. section 360bbb-3(b)(1), unless the authorization is terminated or revoked sooner.  Performed at [facility], [address], [place],  [address]     Radiology: CT Angio Chest PE W and/or Wo Contrast  Result Date: 03/13/2022 CLINICAL DATA:  Pulmonary embolism (PE) suspected, high prob. Pt states she started with a cough last night and then this AM she started coughing up blood- pt states it went away and then this afternoon started back- pt has tissues to show how much blood she can coughed up EXAM: CT ANGIOGRAPHY CHEST WITH CONTRAST TECHNIQUE: Multidetector CT imaging of the chest was performed using the standard protocol during bolus administration of intravenous contrast. Multiplanar CT image reconstructions and MIPs were obtained to evaluate the vascular anatomy. RADIATION DOSE REDUCTION: This exam was performed according to the departmental dose-optimization program which includes automated exposure control, adjustment of the mA and/or kV according to patient size and/or use of iterative reconstruction technique. CONTRAST:  35m OMNIPAQUE IOHEXOL 300 MG/ML  SOLN COMPARISON:  None Available. FINDINGS: Cardiovascular: Satisfactory opacification of the pulmonary  arteries to the segmental level.  No evidence of pulmonary embolism. The main pulmonary artery is normal in caliber. Normal heart size. No significant pericardial effusion. The thoracic aorta is normal in caliber. No atherosclerotic plaque of the thoracic aorta. No coronary artery calcifications. Mediastinum/Nodes: No enlarged mediastinal, hilar, or axillary lymph nodes. Subcentimeter hypodensity within the left thyroid gland. Not clinically significant; no follow-up imaging recommended (ref: J Am Coll Radiol. 2015 Feb;12(2): 143-50). Trachea and and esophagus demonstrate no significant findings. Lungs/Pleura: Right upper lobe peribronchovascular ground-glass opacity that is masslike measuring 5.4 x 3.6 cm. Surrounding scattered patchy ground-glass airspace opacities. Similar fall smaller finding of 0.9 cm within the right lower lobe (6:51). Triangular subpleural micronodule within the right middle lobe likely an intrapulmonary lymph node (6:55). No pleural effusion. No pneumothorax. Upper Abdomen: Status post cholecystectomy.  No acute abnormality. Musculoskeletal: No chest wall abnormality. No suspicious lytic or blastic osseous lesions. No acute displaced fracture. Multilevel degenerative changes of the spine. Review of the MIP images confirms the above findings. IMPRESSION: 1. No pulmonary embolus. 2. Right upper and lower patchy ground-glass airspace opacities with a masslike 5.4 x 3.6 cm ground-glass opacity lesion within the right upper lobe. Finding could represent alveolar hemorrhage versus infection/inflammation. Underlying malignancy not excluded. Recommend follow-up CT in 3 months to evaluate for resolution. Electronically Signed   By: Iven Finn M.D.   On: 03/13/2022 19:16   CT HEAD WO CONTRAST  Result Date: 03/13/2022 CLINICAL DATA:  61 year old female with dizziness. EXAM: CT HEAD WITHOUT CONTRAST TECHNIQUE: Contiguous axial images were obtained from the base of the skull through the vertex  without intravenous contrast. RADIATION DOSE REDUCTION: This exam was performed according to the departmental dose-optimization program which includes automated exposure control, adjustment of the mA and/or kV according to patient size and/or use of iterative reconstruction technique. COMPARISON:  09/24/2018 CT FINDINGS: Brain: No evidence of acute infarction, hemorrhage, hydrocephalus, extra-axial collection or mass lesion/mass effect. Vascular: Carotid atherosclerotic calcifications are noted. Skull: Normal. Negative for fracture or focal lesion. Sinuses/Orbits: No acute finding. Other: None. IMPRESSION: No evidence of acute intracranial abnormality. Electronically Signed   By: Margarette Canada M.D.   On: 03/13/2022 17:12   DG Chest 2 View  Result Date: 03/13/2022 CLINICAL DATA:  Hemoptysis. EXAM: CHEST - 2 VIEW COMPARISON:  09/03/2014 FINDINGS: The cardiomediastinal silhouette is unremarkable. There is no evidence of focal airspace disease, pulmonary edema, suspicious pulmonary nodule/mass, pleural effusion, or pneumothorax. No acute bony abnormalities are identified. Remote RIGHT rib fractures are noted. IMPRESSION: No active cardiopulmonary disease. Electronically Signed   By: Margarette Canada M.D.   On: 03/13/2022 17:07    No results found.  CT Angio Chest PE W and/or Wo Contrast  Result Date: 03/13/2022 CLINICAL DATA:  Pulmonary embolism (PE) suspected, high prob. Pt states she started with a cough last night and then this AM she started coughing up blood- pt states it went away and then this afternoon started back- pt has tissues to show how much blood she can coughed up EXAM: CT ANGIOGRAPHY CHEST WITH CONTRAST TECHNIQUE: Multidetector CT imaging of the chest was performed using the standard protocol during bolus administration of intravenous contrast. Multiplanar CT image reconstructions and MIPs were obtained to evaluate the vascular anatomy. RADIATION DOSE REDUCTION: This exam was performed according to  the departmental dose-optimization program which includes automated exposure control, adjustment of the mA and/or kV according to patient size and/or use of iterative reconstruction technique. CONTRAST:  19m OMNIPAQUE IOHEXOL 300 MG/ML  SOLN COMPARISON:  None Available.  FINDINGS: Cardiovascular: Satisfactory opacification of the pulmonary arteries to the segmental level. No evidence of pulmonary embolism. The main pulmonary artery is normal in caliber. Normal heart size. No significant pericardial effusion. The thoracic aorta is normal in caliber. No atherosclerotic plaque of the thoracic aorta. No coronary artery calcifications. Mediastinum/Nodes: No enlarged mediastinal, hilar, or axillary lymph nodes. Subcentimeter hypodensity within the left thyroid gland. Not clinically significant; no follow-up imaging recommended (ref: J Am Coll Radiol. 2015 Feb;12(2): 143-50). Trachea and and esophagus demonstrate no significant findings. Lungs/Pleura: Right upper lobe peribronchovascular ground-glass opacity that is masslike measuring 5.4 x 3.6 cm. Surrounding scattered patchy ground-glass airspace opacities. Similar fall smaller finding of 0.9 cm within the right lower lobe (6:51). Triangular subpleural micronodule within the right middle lobe likely an intrapulmonary lymph node (6:55). No pleural effusion. No pneumothorax. Upper Abdomen: Status post cholecystectomy.  No acute abnormality. Musculoskeletal: No chest wall abnormality. No suspicious lytic or blastic osseous lesions. No acute displaced fracture. Multilevel degenerative changes of the spine. Review of the MIP images confirms the above findings. IMPRESSION: 1. No pulmonary embolus. 2. Right upper and lower patchy ground-glass airspace opacities with a masslike 5.4 x 3.6 cm ground-glass opacity lesion within the right upper lobe. Finding could represent alveolar hemorrhage versus infection/inflammation. Underlying malignancy not excluded. Recommend follow-up CT  in 3 months to evaluate for resolution. Electronically Signed   By: Iven Finn M.D.   On: 03/13/2022 19:16   CT HEAD WO CONTRAST  Result Date: 03/13/2022 CLINICAL DATA:  61 year old female with dizziness. EXAM: CT HEAD WITHOUT CONTRAST TECHNIQUE: Contiguous axial images were obtained from the base of the skull through the vertex without intravenous contrast. RADIATION DOSE REDUCTION: This exam was performed according to the departmental dose-optimization program which includes automated exposure control, adjustment of the mA and/or kV according to patient size and/or use of iterative reconstruction technique. COMPARISON:  09/24/2018 CT FINDINGS: Brain: No evidence of acute infarction, hemorrhage, hydrocephalus, extra-axial collection or mass lesion/mass effect. Vascular: Carotid atherosclerotic calcifications are noted. Skull: Normal. Negative for fracture or focal lesion. Sinuses/Orbits: No acute finding. Other: None. IMPRESSION: No evidence of acute intracranial abnormality. Electronically Signed   By: Margarette Canada M.D.   On: 03/13/2022 17:12   DG Chest 2 View  Result Date: 03/13/2022 CLINICAL DATA:  Hemoptysis. EXAM: CHEST - 2 VIEW COMPARISON:  09/03/2014 FINDINGS: The cardiomediastinal silhouette is unremarkable. There is no evidence of focal airspace disease, pulmonary edema, suspicious pulmonary nodule/mass, pleural effusion, or pneumothorax. No acute bony abnormalities are identified. Remote RIGHT rib fractures are noted. IMPRESSION: No active cardiopulmonary disease. Electronically Signed   By: Margarette Canada M.D.   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 Cough with hemoptysis Because of the findings of abnormal CT previously recommended getting a follow-up CT of the chest to see if there is any improvement if there is no significant improvement would consider an airway  evaluation at that point with bronchoscopy.  I suspect that she may have had some leftover blood in the airways at this point which is what was seen on the CT of the chest - CT Chest High Resolution; Future  2. Systemic lupus erythematosus, unspecified SLE type, unspecified organ involvement status ([place]) She needs to continue to follow with her rheumatologist.  At this time I do not think she has overt involvement of the  lungs from her lupus but definitely needs to have an I kept on this  3. Rheumatoid arthritis involving multiple sites, unspecified whether rheumatoid factor present [facility]) Medical management continue with supportive care  4. Tobacco dependence due to cigarettes Strongly urged to quit smoking with the history of rheumatoid and lupus and hemoptysis she is strongly urged to quit smoking at this point  5. Shortness of breath  - Pulmonary function test; Future    General Counseling: I have discussed the findings of the evaluation and examination with [place].  I have also discussed any further diagnostic evaluation thatmay be needed or ordered today. Yexalen verbalizes understanding of the findings of todays visit. We also reviewed her medications today and discussed drug interactions and side effects including but not limited excessive drowsiness and altered mental states. We also discussed that there is always a risk not just to her but also people around her. she has been encouraged to call the office with any questions or concerns that should arise related to todays visit.  No orders of the defined types were placed in this encounter.    Time spent: 69  I have personally obtained a history, examined the patient, evaluated laboratory and imaging results, formulated the assessment and plan and placed orders.    Allyne Gee, MD [facility] Pulmonary and Critical Care Sleep medicine

## 2022-04-16 ENCOUNTER — Ambulatory Visit
Admission: RE | Admit: 2022-04-16 | Discharge: 2022-04-16 | Disposition: A | Discharge status: Home / Self Care | Payer: BC Managed Care – PPO | Source: Ambulatory Visit | Attending: Internal Medicine | Admitting: Internal Medicine

## 2022-04-16 DIAGNOSIS — J984 Other disorders of lung: Secondary | ICD-10-CM | POA: Diagnosis not present

## 2022-04-16 DIAGNOSIS — M329 Systemic lupus erythematosus, unspecified: Secondary | ICD-10-CM | POA: Diagnosis not present

## 2022-04-16 DIAGNOSIS — R918 Other nonspecific abnormal finding of lung field: Secondary | ICD-10-CM | POA: Diagnosis not present

## 2022-04-16 DIAGNOSIS — R042 Hemoptysis: Secondary | ICD-10-CM | POA: Insufficient documentation

## 2022-04-16 DIAGNOSIS — I700 Atherosclerosis of aorta: Secondary | ICD-10-CM | POA: Diagnosis not present

## 2022-04-21 ENCOUNTER — Ambulatory Visit: Payer: BC Managed Care – PPO | Admitting: Internal Medicine

## 2022-04-29 ENCOUNTER — Telehealth: Payer: Self-pay

## 2022-04-29 NOTE — Telephone Encounter (Signed)
Pt notified for CT result discuss in detail at follow up

## 2022-04-29 NOTE — Telephone Encounter (Signed)
-----   Message from Allyne Gee, MD sent at 04/29/2022 10:44 AM EDT ----- Let her know CT looked better needs to be watched

## 2022-04-30 ENCOUNTER — Telehealth: Payer: Self-pay

## 2022-04-30 NOTE — Telephone Encounter (Signed)
Left vm to confirm 05/05/22 appointment-Toni

## 2022-05-05 ENCOUNTER — Ambulatory Visit (INDEPENDENT_AMBULATORY_CARE_PROVIDER_SITE_OTHER): Payer: BC Managed Care – PPO | Admitting: Internal Medicine

## 2022-05-05 DIAGNOSIS — R0602 Shortness of breath: Secondary | ICD-10-CM | POA: Diagnosis not present

## 2022-05-06 DIAGNOSIS — F411 Generalized anxiety disorder: Secondary | ICD-10-CM | POA: Diagnosis not present

## 2022-05-06 DIAGNOSIS — F330 Major depressive disorder, recurrent, mild: Secondary | ICD-10-CM | POA: Diagnosis not present

## 2022-05-06 DIAGNOSIS — F900 Attention-deficit hyperactivity disorder, predominantly inattentive type: Secondary | ICD-10-CM | POA: Diagnosis not present

## 2022-05-07 ENCOUNTER — Ambulatory Visit (INDEPENDENT_AMBULATORY_CARE_PROVIDER_SITE_OTHER): Payer: BC Managed Care – PPO | Admitting: Physician Assistant

## 2022-05-07 ENCOUNTER — Encounter: Payer: Self-pay | Admitting: Physician Assistant

## 2022-05-07 VITALS — BP 117/67 | HR 81 | Temp 97.9°F | Resp 16 | Ht 66.0 in | Wt 130.0 lb

## 2022-05-07 DIAGNOSIS — M069 Rheumatoid arthritis, unspecified: Secondary | ICD-10-CM

## 2022-05-07 DIAGNOSIS — R5383 Other fatigue: Secondary | ICD-10-CM

## 2022-05-07 DIAGNOSIS — E538 Deficiency of other specified B group vitamins: Secondary | ICD-10-CM

## 2022-05-07 DIAGNOSIS — Z7689 Persons encountering health services in other specified circumstances: Secondary | ICD-10-CM

## 2022-05-07 DIAGNOSIS — E559 Vitamin D deficiency, unspecified: Secondary | ICD-10-CM

## 2022-05-07 DIAGNOSIS — R946 Abnormal results of thyroid function studies: Secondary | ICD-10-CM

## 2022-05-07 DIAGNOSIS — M329 Systemic lupus erythematosus, unspecified: Secondary | ICD-10-CM

## 2022-05-07 DIAGNOSIS — E782 Mixed hyperlipidemia: Secondary | ICD-10-CM

## 2022-05-07 NOTE — Progress Notes (Unsigned)
[facility] [place], [place] [address]  Internal MEDICINE  Office Visit Note  Patient Name: Hannah Hurst  [id number]  [id number]  Date of Service: 05/13/2022   Complaints/HPI Pt is here for establishment of PCP. Chief Complaint  Patient presents with   New Patient (Initial Visit)   Anxiety   HPI Pt is here to establish care -Has been without PCP for awhile -Sees Dr. Humphrey Rolls for pulmonology -Hx of lupus and RA and was seeing rheumatology needs to establish again. Has been on methotrexate previously, but at highest level started not feeling well and hasnt been on anything since. Needs referral today -hx of high cholesterol but had problems with her liver and couldn't do statins -Currently smoking and working on quitting. May try gum. 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 -Occasional alcohol use, did marijuana gummies while out in [place].  -Followed by psychiatry -Hx of vulvar cancer, had uterus removed initially and states that then everything internal was removed. This was 30 years ago. Last pap in 2013. Not sexually active, because would have problems after. Has not required any further follow up for this and no symptoms. -Her son lives with her. He has had several health concerns.  Current Medication: Outpatient Encounter Medications as of 05/07/2022  Medication Sig   amphetamine-dextroamphetamine (ADDERALL XR) 10 MG 24 hr capsule Take by mouth.   amphetamine-dextroamphetamine (ADDERALL XR) 30 MG 24 hr capsule Take by mouth.   betamethasone dipropionate (DIPROLENE) 0.05 % ointment Apply topically 2 (two) times daily.   busPIRone (BUSPAR) 15 MG tablet Take 15 mg by mouth 2 (two) times daily.   escitalopram (LEXAPRO) 20 MG tablet Take 20 mg by mouth daily.   No facility-administered encounter medications on file as of 05/07/2022.    Surgical History: Past  Surgical History:  Procedure Laterality Date   ABDOMINAL HYSTERECTOMY     CHOLECYSTECTOMY     ilestomy reversal  2016   NASAL POLYP SURGERY     PANCREAS SURGERY     TOTAL KNEE ARTHROPLASTY Right 08/07/2014   dr Percell Miller   TOTAL KNEE ARTHROPLASTY Right 08/07/2014   Procedure: RIGHT TOTAL KNEE ARTHROPLASTY;  Surgeon: Ninetta Lights, MD;  Location: [place];  Service: Orthopedics;  Laterality: Right;    Medical History: Past Medical History:  Diagnosis Date   Anxiety    adhd   Arthritis    PRIMARY GENERAL BILATERAL KNEES   Cancer ([place])    Family history of adverse reaction to anesthesia    MOTHER HAS NAUSEA   History of reversal of ileostomy 2016   Ileostomy in place ([place]) 2015   Lupus ([place])     Family History: Family History  Problem Relation Age of Onset   Varicose Veins Mother    Kidney disease Mother    Heart disease Mother    Diabetes Mother    Depression Mother    COPD Mother    Arthritis Mother    Stroke Father    Heart disease Father    Hypertension Father    Alzheimer's disease Father    Arthritis Sister    Hyperlipidemia Sister    Varicose Veins Sister    Arthritis Brother    Hypertension Brother    Hyperlipidemia Brother    Arthritis Son    Arthritis Maternal Aunt    COPD Maternal Aunt    Hyperlipidemia Maternal Aunt  Heart disease Maternal Aunt    Stroke Maternal Aunt    Lupus Maternal Aunt    Alzheimer's disease Paternal Grandmother    Alcohol abuse Paternal Grandfather     Social History   Socioeconomic History   Marital status: Divorced    Spouse name: Not on file   Number of children: Not on file   Years of education: Not on file   Highest education level: Not on file  Occupational History   Not on file  Tobacco Use   Smoking status: Every Day    Packs/day: 0.50    Years: 20.00    Total pack years: 10.00    Types: Cigarettes   Smokeless tobacco: Never   Tobacco comments:    2 a days 05/07/22  Substance and Sexual Activity    Alcohol use: Yes    Comment: social   Drug use: No   Sexual activity: Not on file  Other Topics Concern   Not on file  Social History Narrative   Not on file   Social Determinants of Health   Financial Resource Strain: Not on file  Food Insecurity: Not on file  Transportation Needs: Not on file  Physical Activity: Not on file  Stress: Not on file  Social Connections: Not on file  Intimate Partner Violence: Not on file     Review of Systems  Constitutional:  Negative for chills, fatigue and unexpected weight change.  HENT:  Negative for congestion, postnasal drip, rhinorrhea, sneezing and sore throat.   Eyes:  Negative for redness.  Respiratory:  Negative for cough, chest tightness and shortness of breath.   Cardiovascular:  Negative for chest pain and palpitations.  Gastrointestinal:  Negative for abdominal pain, constipation, diarrhea, nausea and vomiting.  Genitourinary:  Negative for dysuria and frequency.  Musculoskeletal:  Positive for arthralgias. Negative for back pain, joint swelling and neck pain.  Skin:  Negative for rash.  Neurological: Negative.  Negative for tremors and numbness.  Hematological:  Negative for adenopathy. Does not bruise/bleed easily.  Psychiatric/Behavioral:  Negative for behavioral problems (Depression), sleep disturbance and suicidal ideas. The patient is nervous/anxious.     Vital Signs: BP 117/67   Pulse 81   Temp 97.9 F (36.6 C)   Resp 16   Ht '5\' 6"'$  (1.676 m)   Wt 130 lb (59 kg)   SpO2 97%   BMI 20.98 kg/m    Physical Exam Vitals and nursing note reviewed.  Constitutional:      General: She is not in acute distress.    Appearance: Normal appearance. She is well-developed and normal weight. She is not diaphoretic.  HENT:     Head: Normocephalic and atraumatic.     Mouth/Throat:     Pharynx: No oropharyngeal exudate.  Eyes:     Pupils: Pupils are equal, round, and reactive to light.  Neck:     Thyroid: No thyromegaly.      Vascular: No JVD.     Trachea: No tracheal deviation.  Cardiovascular:     Rate and Rhythm: Normal rate and regular rhythm.     Heart sounds: Normal heart sounds. No murmur heard.    No friction rub. No gallop.  Pulmonary:     Effort: Pulmonary effort is normal. No respiratory distress.     Breath sounds: No wheezing or rales.  Chest:     Chest wall: No tenderness.  Abdominal:     General: Bowel sounds are normal.     Palpations: Abdomen is  soft.  Musculoskeletal:        General: Normal range of motion.     Cervical back: Normal range of motion and neck supple.  Lymphadenopathy:     Cervical: No cervical adenopathy.  Skin:    General: Skin is warm and dry.  Neurological:     Mental Status: She is alert and oriented to person, place, and time.     Cranial Nerves: No cranial nerve deficit.  Psychiatric:        Behavior: Behavior normal.        Thought Content: Thought content normal.        Judgment: Judgment normal.       Assessment/Plan: 1. Rheumatoid arthritis involving multiple sites, unspecified whether rheumatoid factor present [facility]) Will refer to rheumatology - Ambulatory referral to Rheumatology  2. Systemic lupus erythematosus, unspecified SLE type, unspecified organ involvement status [facility]) Will refer to rheumatology - Ambulatory referral to Rheumatology  3. Encounter to establish care with new doctor Reviewed medical history and will order routine fasting labs  4. Vitamin D deficiency - VITAMIN D 25 Hydroxy (Vit-D Deficiency, Fractures)  5. B12 deficiency - B12 and Folate Panel  6. Mixed hyperlipidemia - Lipid Panel With LDL/HDL Ratio  7. Abnormal thyroid exam - TSH + free T4  8. Other fatigue - CBC w/Diff/Platelet - Comprehensive metabolic panel - Lipid Panel With LDL/HDL Ratio - TSH + free T4 - VITAMIN D 25 Hydroxy (Vit-D Deficiency, Fractures) - B12 and Folate Panel - Iron, TIBC and Ferritin Panel   General Counseling: Hilma verbalizes  understanding of the findings of todays visit and agrees with plan of treatment. I have discussed any further diagnostic evaluation that may be needed or ordered today. We also reviewed her medications today. she has been encouraged to call the office with any questions or concerns that should arise related to todays visit.    Counseling:    Orders Placed This Encounter  Procedures   CBC w/Diff/Platelet   Comprehensive metabolic panel   Lipid Panel With LDL/HDL Ratio   TSH + free T4   VITAMIN D 25 Hydroxy (Vit-D Deficiency, Fractures)   B12 and Folate Panel   Iron, TIBC and Ferritin Panel   Ambulatory referral to Rheumatology    No orders of the defined types were placed in this encounter.    This patient was seen by Drema Dallas, PA-C in collaboration with Dr. Clayborn Bigness as a part of collaborative care agreement.   Time spent:40 Minutes

## 2022-05-11 ENCOUNTER — Telehealth: Payer: Self-pay

## 2022-05-11 NOTE — Telephone Encounter (Signed)
Medical record request faxed to [facility] Rheumatology [phone]

## 2022-05-16 NOTE — Procedures (Signed)
[facility] [place], [address]    Complete Pulmonary Function Testing Interpretation:  FINDINGS:  Forced vital capacity is normal FEV1 is normal-FVC ratio is decreased postbronchodilator no significant change was noted total lung capacity is increased residual volume is increased residual on total lung capacity ratio is increased FRC is increased.  DLCO was normal  IMPRESSION:  This pulmonary function study is basically within normal limits clinical correlation is recommended  Hannah Gee, MD [facility] Pulmonary Critical Care Medicine Sleep Medicine

## 2022-05-17 DIAGNOSIS — F330 Major depressive disorder, recurrent, mild: Secondary | ICD-10-CM | POA: Diagnosis not present

## 2022-05-17 DIAGNOSIS — F900 Attention-deficit hyperactivity disorder, predominantly inattentive type: Secondary | ICD-10-CM | POA: Diagnosis not present

## 2022-05-17 DIAGNOSIS — F411 Generalized anxiety disorder: Secondary | ICD-10-CM | POA: Diagnosis not present

## 2022-05-17 LAB — PULMONARY FUNCTION TEST

## 2022-05-20 ENCOUNTER — Encounter: Payer: Self-pay | Admitting: Internal Medicine

## 2022-05-20 ENCOUNTER — Ambulatory Visit (INDEPENDENT_AMBULATORY_CARE_PROVIDER_SITE_OTHER): Payer: BC Managed Care – PPO | Admitting: Internal Medicine

## 2022-05-20 VITALS — BP 121/65 | HR 75 | Temp 97.7°F | Resp 16 | Ht 66.0 in | Wt 130.6 lb

## 2022-05-20 DIAGNOSIS — M069 Rheumatoid arthritis, unspecified: Secondary | ICD-10-CM | POA: Diagnosis not present

## 2022-05-20 DIAGNOSIS — M329 Systemic lupus erythematosus, unspecified: Secondary | ICD-10-CM

## 2022-05-20 DIAGNOSIS — T17908S Unspecified foreign body in respiratory tract, part unspecified causing other injury, sequela: Secondary | ICD-10-CM

## 2022-05-20 DIAGNOSIS — R042 Hemoptysis: Secondary | ICD-10-CM | POA: Diagnosis not present

## 2022-05-20 NOTE — Progress Notes (Signed)
[facility] [place], [place] [address]  Pulmonary Sleep Medicine   Office Visit Note  Patient Name: Hannah Hurst DOB: 09-02-1961 MRN [id number]  Date of Service: 05/20/2022  Complaints/HPI: She states that she is feeling better. She states that she has noted there has been some fatigue. She states there is some issue with her nose and she has been having some concern for aspiration. She has had problems at night feels as though she is aspirating. She does not have a dry mouth. She notes this with a lot of fluids. Follow up CT shows near complete resolution of the GGO. She had been smoking E cig but now is not. She also states that she is not smoking regular cigs either. PFT had been done and actually shows normal function with an FEV1 of 108% of predicted.  In addition she says that the hemoptysis has resolved not having any further episodes  ROS  General: (-) fever, (-) chills, (-) night sweats, (-) weakness Skin: (-) rashes, (-) itching,. Eyes: (-) visual changes, (-) redness, (-) itching. Nose and Sinuses: (-) nasal stuffiness or itchiness, (-) postnasal drip, (-) nosebleeds, (-) sinus trouble. Mouth and Throat: (-) sore throat, (-) hoarseness. Neck: (-) swollen glands, (-) enlarged thyroid, (-) neck pain. Respiratory: + cough, (-) bloody sputum, + shortness of breath, - wheezing. Cardiovascular: - ankle swelling, (-) chest pain. Lymphatic: (-) lymph node enlargement. Neurologic: (-) numbness, (-) tingling. Psychiatric: (-) anxiety, (-) depression   Current Medication: Outpatient Encounter Medications as of 05/20/2022  Medication Sig   betamethasone dipropionate (DIPROLENE) 0.05 % ointment Apply topically 2 (two) times daily.   busPIRone (BUSPAR) 15 MG tablet Take 15 mg by mouth 2 (two) times daily.   escitalopram (LEXAPRO) 20 MG tablet Take 20 mg by mouth daily.   [DISCONTINUED] amphetamine-dextroamphetamine (ADDERALL XR) 10 MG 24 hr capsule Take by  mouth.   [DISCONTINUED] amphetamine-dextroamphetamine (ADDERALL XR) 30 MG 24 hr capsule Take by mouth.   No facility-administered encounter medications on file as of 05/20/2022.    Surgical History: Past Surgical History:  Procedure Laterality Date   ABDOMINAL HYSTERECTOMY     CHOLECYSTECTOMY     ilestomy reversal  2016   NASAL POLYP SURGERY     PANCREAS SURGERY     TOTAL KNEE ARTHROPLASTY Right 08/07/2014   dr Percell Miller   TOTAL KNEE ARTHROPLASTY Right 08/07/2014   Procedure: RIGHT TOTAL KNEE ARTHROPLASTY;  Surgeon: Ninetta Lights, MD;  Location: [facility];  Service: Orthopedics;  Laterality: Right;    Medical History: Past Medical History:  Diagnosis Date   Anxiety    adhd   Arthritis    PRIMARY GENERAL BILATERAL KNEES   Cancer ([place])    Family history of adverse reaction to anesthesia    MOTHER HAS NAUSEA   History of reversal of ileostomy 2016   Ileostomy in place ([place]) 2015   Lupus ([place])     Family History: Family History  Problem Relation Age of Onset   Varicose Veins Mother    Kidney disease Mother    Heart disease Mother    Diabetes Mother    Depression Mother    COPD Mother    Arthritis Mother    Stroke Father    Heart disease Father    Hypertension Father    Alzheimer's disease Father    Arthritis Sister    Hyperlipidemia Sister    Varicose Veins Sister    Arthritis Brother    Hypertension Brother  Hyperlipidemia Brother    Arthritis Son    Arthritis Maternal Aunt    COPD Maternal Aunt    Hyperlipidemia Maternal Aunt    Heart disease Maternal Aunt    Stroke Maternal Aunt    Lupus Maternal Aunt    Alzheimer's disease Paternal Grandmother    Alcohol abuse Paternal Grandfather     Social History: Social History   Socioeconomic History   Marital status: Divorced    Spouse name: Not on file   Number of children: Not on file   Years of education: Not on file   Highest education level: Not on file  Occupational History   Not on file  Tobacco  Use   Smoking status: Every Day    Packs/day: 0.50    Years: 20.00    Total pack years: 10.00    Types: Cigarettes   Smokeless tobacco: Never   Tobacco comments:    2 a days 05/07/22  Substance and Sexual Activity   Alcohol use: Yes    Comment: social   Drug use: No   Sexual activity: Not on file  Other Topics Concern   Not on file  Social History Narrative   Not on file   Social Determinants of Health   Financial Resource Strain: Not on file  Food Insecurity: Not on file  Transportation Needs: Not on file  Physical Activity: Not on file  Stress: Not on file  Social Connections: Not on file  Intimate Partner Violence: Not on file    Vital Signs: Blood pressure 121/65, pulse 75, temperature 97.7 F (36.5 C), resp. rate 16, height '5\' 6"'$  (1.676 m), weight 130 lb 9.6 oz (59.2 kg), SpO2 99 %.  Examination: General Appearance: The patient is well-developed, well-nourished, and in no distress. Skin: Gross inspection of skin unremarkable. Head: normocephalic, no gross deformities. Eyes: no gross deformities noted. ENT: ears appear grossly normal no exudates. Neck: Supple. No thyromegaly. No LAD. Respiratory: no rhonchi. Cardiovascular: Normal S1 and S2 without murmur or rub. Extremities: No cyanosis. pulses are equal. Neurologic: Alert and oriented. No involuntary movements.  LABS: Recent Results (from the past 2160 hour(s))  CBC     Status: None   Collection Time: 03/13/22  4:30 PM  Result Value Ref Range   WBC 6.9 4.0 - 10.5 K/uL   RBC 4.35 3.87 - 5.11 MIL/uL   Hemoglobin 12.9 12.0 - 15.0 g/dL   HCT 40.2 36.0 - 46.0 %   MCV 92.4 80.0 - 100.0 fL   MCH 29.7 26.0 - 34.0 pg   MCHC 32.1 30.0 - 36.0 g/dL   RDW 13.8 11.5 - 15.5 %   Platelets 305 150 - 400 K/uL   nRBC 0.0 0.0 - 0.2 %    Comment: Performed at [facility], [address] [place], [place] [address]  Comprehensive metabolic panel     Status: Abnormal   Collection Time: 03/13/22  4:30 PM   Result Value Ref Range   Sodium 142 135 - 145 mmol/L   Potassium 4.4 3.5 - 5.1 mmol/L   Chloride 106 98 - 111 mmol/L   CO2 27 22 - 32 mmol/L   Glucose, Bld 107 (H) 70 - 99 mg/dL    Comment: Glucose reference range applies only to samples taken after fasting for at least 8 hours.   BUN 18 6 - 20 mg/dL   Creatinine, Ser 0.59 0.44 - 1.00 mg/dL   Calcium 9.6 8.9 - 10.3 mg/dL   Total Protein 7.7 6.5 -  8.1 g/dL   Albumin 4.5 3.5 - 5.0 g/dL   AST 29 15 - 41 U/L   ALT 24 0 - 44 U/L   Alkaline Phosphatase 82 38 - 126 U/L   Total Bilirubin 0.8 0.3 - 1.2 mg/dL   GFR, Estimated >60 >60 mL/min    Comment: (NOTE) Calculated using the CKD-EPI Creatinine Equation (2021)    Anion gap 9 5 - 15    Comment: Performed at [facility], [address] [place], [place] [address]  Urinalysis, Routine w reflex microscopic     Status: Abnormal   Collection Time: 03/13/22  4:32 PM  Result Value Ref Range   Color, Urine YELLOW (A) YELLOW   APPearance CLEAR (A) CLEAR   Specific Gravity, Urine 1.018 1.005 - 1.030   pH 7.0 5.0 - 8.0   Glucose, UA NEGATIVE NEGATIVE mg/dL   Hgb urine dipstick NEGATIVE NEGATIVE   Bilirubin Urine NEGATIVE NEGATIVE   Ketones, ur NEGATIVE NEGATIVE mg/dL   Protein, ur NEGATIVE NEGATIVE mg/dL   Nitrite NEGATIVE NEGATIVE   Leukocytes,Ua NEGATIVE NEGATIVE    Comment: Performed at [facility], [address] [place], [place] [address]  SARS Coronavirus 2 by RT PCR (hospital order, performed in [place] hospital lab) *cepheid single result test* Anterior Nasal Swab     Status: None   Collection Time: 03/13/22  6:46 PM   Specimen: Anterior Nasal Swab  Result Value Ref Range   SARS Coronavirus 2 by RT PCR NEGATIVE NEGATIVE    Comment: (NOTE) SARS-CoV-2 target nucleic acids are NOT DETECTED.  The SARS-CoV-2 RNA is generally detectable in upper and lower respiratory specimens during the acute phase of infection. The lowest concentration of SARS-CoV-2 viral  copies this assay can detect is 250 copies / mL. A negative result does not preclude SARS-CoV-2 infection and should not be used as the sole basis for treatment or other patient management decisions.  A negative result may occur with improper specimen collection / handling, submission of specimen other than nasopharyngeal swab, presence of viral mutation(s) within the areas targeted by this assay, and inadequate number of viral copies (<250 copies / mL). A negative result must be combined with clinical observations, patient history, and epidemiological information.  Fact Sheet for Patients:   https://www.patel.info/  Fact Sheet for Healthcare Providers: https://hall.com/  This test is not yet approved or  cleared by the [place] FDA and has been authorized for detection and/or diagnosis of SARS-CoV-2 by FDA under an Emergency Use Authorization (EUA).  This EUA will remain in effect (meaning this test can be used) for the duration of the COVID-19 declaration under Section 564(b)(1) of the Act, 21 U.S.C. section 360bbb-3(b)(1), unless the authorization is terminated or revoked sooner.  Performed at [facility], [address] [place], [place] [address]   Pulmonary Function Test     Status: None   Collection Time: 05/17/22  3:36 PM  Result Value Ref Range   FEV1     FVC     FEV1/FVC     TLC     DLCO      Radiology: CT Chest High Resolution  Result Date: 04/17/2022 CLINICAL DATA:  Hemoptysis EXAM: CT CHEST WITHOUT CONTRAST TECHNIQUE: Multidetector CT imaging of the chest was performed following the standard protocol without intravenous contrast. High resolution imaging of the lungs, as well as inspiratory and expiratory imaging, was performed. RADIATION DOSE REDUCTION: This exam was performed according to the departmental dose-optimization program which includes  automated exposure control, adjustment of the mA and/or kV  according to patient size and/or use of iterative reconstruction technique. COMPARISON:  03/13/2022 FINDINGS: Cardiovascular: Scattered aortic atherosclerosis. Normal heart size. No pericardial effusion. Mediastinum/Nodes: No enlarged mediastinal, hilar, or axillary lymph nodes. Thyroid gland, trachea, and esophagus demonstrate no significant findings. Lungs/Pleura: Previously noted large, focal ground-glass opacity of the posterior right upper lobe is almost completely resolved, with mild residual ground-glass in the right apex (series 10, image 37). Mild, scattered ground-glass airspace opacity and scarring bilaterally, most conspicuously in the medial segment right middle lobe (series 10, image 198). Small clustered subsolid nodules in the dependent right lower lobe and peripheral left lower lobe are new, largest measuring 0.5 cm (series 10, image 204). No significant air trapping on expiratory phase imaging. No pleural effusion or pneumothorax. Upper Abdomen: No acute abnormality. Musculoskeletal: No chest wall abnormality. No suspicious osseous lesions identified. IMPRESSION: 1. Previously noted large, focal ground-glass opacity of the posterior right upper lobe is almost completely resolved, with mild residual ground-glass in the right apex. 2. Small clustered subsolid nodules in the dependent right lower lobe and peripheral left lower lobe are new, largest measuring 0.5 cm. Findings are consistent with ongoing atypical infection or aspiration. 3. No specific findings explain hemoptysis such as mass or endobronchial lesion. Aortic Atherosclerosis (ICD10-I70.0). Electronically Signed   By: Delanna Ahmadi M.D.   On: 04/17/2022 13:45    No results found.  No results found.    Assessment and Plan: Patient Active Problem List   Diagnosis Date Noted   DJD (degenerative joint disease) of knee 08/07/2014    1. Hemoptysis This appears to have resolved I suspect related to e-cigarette use in addition  there has been significant resolution of the groundglass opacities that have been seen on the previous films last CT July 24 shows resolution  2. Aspiration into airway, sequela I think she may be aspirating once again recommend getting a swallowing assessment we will go ahead and schedule the modified barium study - SLP modified barium swallow; Future  3. Systemic lupus erythematosus, unspecified SLE type, unspecified organ involvement status ([place]) Continue to follow-up with her primary rheumatologist  4. Rheumatoid arthritis involving multiple sites, unspecified whether rheumatoid factor present [facility]) Under better control patient still needs to continue with her rheumatologist.  General Counseling: I have discussed the findings of the evaluation and examination with Maudie Mercury.  I have also discussed any further diagnostic evaluation thatmay be needed or ordered today. Angeleen verbalizes understanding of the findings of todays visit. We also reviewed her medications today and discussed drug interactions and side effects including but not limited excessive drowsiness and altered mental states. We also discussed that there is always a risk not just to her but also people around her. she has been encouraged to call the office with any questions or concerns that should arise related to todays visit.  Orders Placed This Encounter  Procedures   SLP modified barium swallow    Standing Status:   Future    Standing Expiration Date:   05/21/2023    Order Specific Question:   Where should this test be performed:    Answer:   Other    Comments:   [facility]    Order Specific Question:   Please indicate reason for Referral:    Answer:   Concerned about Dysphagia/Aspiration    Order Specific Question:   Patients current diet consistency:    Answer:   Regular     Time  spent: 4  I have personally obtained a history, examined the patient, evaluated laboratory and imaging results, formulated the assessment and plan and  placed orders.    Allyne Gee, MD [facility] Pulmonary and Critical Care Sleep medicine

## 2022-05-25 ENCOUNTER — Telehealth: Payer: Self-pay

## 2022-05-25 NOTE — Telephone Encounter (Signed)
Received MR from [place]. Sent to be scanned-Toni

## 2022-05-25 NOTE — Addendum Note (Signed)
Addended by: Devona Konig A on: 05/25/2022 10:28 AM   Modules accepted: Orders

## 2022-06-04 ENCOUNTER — Ambulatory Visit
Admission: RE | Admit: 2022-06-04 | Discharge: 2022-06-04 | Disposition: A | Discharge status: Home / Self Care | Payer: BC Managed Care – PPO | Source: Ambulatory Visit | Attending: Internal Medicine | Admitting: Internal Medicine

## 2022-06-04 DIAGNOSIS — R1310 Dysphagia, unspecified: Secondary | ICD-10-CM | POA: Insufficient documentation

## 2022-06-04 DIAGNOSIS — T17908S Unspecified foreign body in respiratory tract, part unspecified causing other injury, sequela: Secondary | ICD-10-CM | POA: Insufficient documentation

## 2022-06-04 NOTE — Progress Notes (Signed)
Modified Barium Swallow Progress Note  Patient Details  Name: Hannah Hurst MRN: [id number] Date of Birth: May 01, 1961  Today's Date: 06/04/2022  Modified Barium Swallow completed.  Full report located under Chart Review in the Imaging Section.  Brief recommendations include the following:  Clinical Impression  Pt presents as a good historian and reports that she frequently experiences a globus sensation - pointing to her clavicle region. Pt presents with adequate oropharyngeal abilities when consuming thin liquids via spoon and cup as well as puree, graham cracker with puree and whole barium tablet with thin liquids. However, stasis of bolus with retrograde movement was observed within the cervical esophagus. At this time, education was provided to pt on reflux precautions with recommendations to follow with GI. Pt voiced understanding with all questions answered to pt satisfaction.   Swallow Evaluation Recommendations   Recommended Consults: Consider GI evaluation;Consider esophageal assessment   SLP Diet Recommendations: Regular solids;Thin liquid   Liquid Administration via: Cup   Medication Administration: Whole meds with liquid   Supervision: Patient able to self feed   Compensations: Minimize environmental distractions;Slow rate;Small sips/bites   Postural Changes: Remain semi-upright after after feeds/meals (Comment);Seated upright at 90 degrees   Oral Care Recommendations: Oral care BID      Matheson Vandehei B. Rutherford Nail, M.S., CCC-SLP, Mining engineer Certified Brain Injury [place]  [place] Office [phone] Ascom [phone] Fax [phone]

## 2022-06-10 DIAGNOSIS — E782 Mixed hyperlipidemia: Secondary | ICD-10-CM | POA: Diagnosis not present

## 2022-06-10 DIAGNOSIS — E538 Deficiency of other specified B group vitamins: Secondary | ICD-10-CM | POA: Diagnosis not present

## 2022-06-10 DIAGNOSIS — E559 Vitamin D deficiency, unspecified: Secondary | ICD-10-CM | POA: Diagnosis not present

## 2022-06-10 DIAGNOSIS — R946 Abnormal results of thyroid function studies: Secondary | ICD-10-CM | POA: Diagnosis not present

## 2022-06-10 DIAGNOSIS — R5383 Other fatigue: Secondary | ICD-10-CM | POA: Diagnosis not present

## 2022-06-11 LAB — CBC WITH DIFFERENTIAL/PLATELET
Basophils Absolute: 0 10*3/uL (ref 0.0–0.2)
Basos: 0 %
EOS (ABSOLUTE): 0 10*3/uL (ref 0.0–0.4)
Eos: 1 %
Hematocrit: 42.6 % (ref 34.0–46.6)
Hemoglobin: 14.1 g/dL (ref 11.1–15.9)
Immature Grans (Abs): 0 10*3/uL (ref 0.0–0.1)
Immature Granulocytes: 0 %
Lymphocytes Absolute: 1.6 10*3/uL (ref 0.7–3.1)
Lymphs: 21 %
MCH: 30.3 pg (ref 26.6–33.0)
MCHC: 33.1 g/dL (ref 31.5–35.7)
MCV: 92 fL (ref 79–97)
Monocytes Absolute: 0.5 10*3/uL (ref 0.1–0.9)
Monocytes: 7 %
Neutrophils Absolute: 5.4 10*3/uL (ref 1.4–7.0)
Neutrophils: 71 %
Platelets: 263 10*3/uL (ref 150–450)
RBC: 4.65 x10E6/uL (ref 3.77–5.28)
RDW: 12.4 % (ref 11.7–15.4)
WBC: 7.5 10*3/uL (ref 3.4–10.8)

## 2022-06-11 LAB — LIPID PANEL WITH LDL/HDL RATIO
Cholesterol, Total: 236 mg/dL — ABNORMAL HIGH (ref 100–199)
HDL: 108 mg/dL (ref >39)
LDL Chol Calc (NIH): 117 mg/dL — ABNORMAL HIGH (ref 0–99)
LDL/HDL Ratio: 1.1 ratio (ref 0.0–3.2)
Triglycerides: 66 mg/dL (ref 0–149)
VLDL Cholesterol Cal: 11 mg/dL (ref 5–40)

## 2022-06-11 LAB — COMPREHENSIVE METABOLIC PANEL
ALT: 14 IU/L (ref 0–32)
AST: 21 IU/L (ref 0–40)
Albumin/Globulin Ratio: 2.8 — ABNORMAL HIGH (ref 1.2–2.2)
Albumin: 5.1 g/dL — ABNORMAL HIGH (ref 3.8–4.9)
Alkaline Phosphatase: 88 IU/L (ref 44–121)
BUN/Creatinine Ratio: 28 — ABNORMAL HIGH (ref 9–23)
BUN: 17 mg/dL (ref 6–24)
Bilirubin Total: 0.6 mg/dL (ref 0.0–1.2)
CO2: 22 mmol/L (ref 20–29)
Calcium: 9.6 mg/dL (ref 8.7–10.2)
Chloride: 102 mmol/L (ref 96–106)
Creatinine, Ser: 0.61 mg/dL (ref 0.57–1.00)
Globulin, Total: 1.8 g/dL (ref 1.5–4.5)
Glucose: 104 mg/dL — ABNORMAL HIGH (ref 70–99)
Potassium: 4.2 mmol/L (ref 3.5–5.2)
Sodium: 140 mmol/L (ref 134–144)
Total Protein: 6.9 g/dL (ref 6.0–8.5)
eGFR: 103 mL/min/{1.73_m2} (ref >59)

## 2022-06-11 LAB — IRON,TIBC AND FERRITIN PANEL
Ferritin: 132 ng/mL (ref 15–150)
Iron Saturation: 41 % (ref 15–55)
Iron: 131 ug/dL (ref 27–159)
Total Iron Binding Capacity: 316 ug/dL (ref 250–450)
UIBC: 185 ug/dL (ref 131–425)

## 2022-06-11 LAB — B12 AND FOLATE PANEL
Folate: 5.9 ng/mL (ref >3.0)
Vitamin B-12: 677 pg/mL (ref 232–1245)

## 2022-06-11 LAB — TSH+FREE T4
Free T4: 0.92 ng/dL (ref 0.82–1.77)
TSH: 0.781 u[IU]/mL (ref 0.450–4.500)

## 2022-06-11 LAB — VITAMIN D 25 HYDROXY (VIT D DEFICIENCY, FRACTURES): Vit D, 25-Hydroxy: 10.9 ng/mL — ABNORMAL LOW (ref 30.0–100.0)

## 2022-06-14 ENCOUNTER — Ambulatory Visit (INDEPENDENT_AMBULATORY_CARE_PROVIDER_SITE_OTHER): Payer: BC Managed Care – PPO | Admitting: Physician Assistant

## 2022-06-14 ENCOUNTER — Encounter: Payer: Self-pay | Admitting: Physician Assistant

## 2022-06-14 ENCOUNTER — Encounter: Payer: BC Managed Care – PPO | Admitting: Nurse Practitioner

## 2022-06-14 VITALS — BP 139/85 | HR 98 | Temp 98.4°F | Resp 16 | Ht 66.0 in | Wt 131.0 lb

## 2022-06-14 DIAGNOSIS — Z0001 Encounter for general adult medical examination with abnormal findings: Secondary | ICD-10-CM | POA: Diagnosis not present

## 2022-06-14 DIAGNOSIS — Z1211 Encounter for screening for malignant neoplasm of colon: Secondary | ICD-10-CM

## 2022-06-14 DIAGNOSIS — Z9889 Other specified postprocedural states: Secondary | ICD-10-CM | POA: Diagnosis not present

## 2022-06-14 DIAGNOSIS — Z1212 Encounter for screening for malignant neoplasm of rectum: Secondary | ICD-10-CM

## 2022-06-14 DIAGNOSIS — Z1231 Encounter for screening mammogram for malignant neoplasm of breast: Secondary | ICD-10-CM

## 2022-06-14 DIAGNOSIS — R1319 Other dysphagia: Secondary | ICD-10-CM

## 2022-06-14 DIAGNOSIS — E782 Mixed hyperlipidemia: Secondary | ICD-10-CM

## 2022-06-14 DIAGNOSIS — R300 Dysuria: Secondary | ICD-10-CM

## 2022-06-14 DIAGNOSIS — M069 Rheumatoid arthritis, unspecified: Secondary | ICD-10-CM

## 2022-06-14 DIAGNOSIS — M329 Systemic lupus erythematosus, unspecified: Secondary | ICD-10-CM | POA: Diagnosis not present

## 2022-06-14 DIAGNOSIS — E559 Vitamin D deficiency, unspecified: Secondary | ICD-10-CM

## 2022-06-14 DIAGNOSIS — R110 Nausea: Secondary | ICD-10-CM

## 2022-06-14 MED ORDER — ERGOCALCIFEROL 1.25 MG (50000 UT) PO CAPS: ORAL_CAPSULE | ORAL | 3 refills | Status: DC
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
## 2022-06-15 ENCOUNTER — Ambulatory Visit: Payer: BC Managed Care – PPO | Admitting: Internal Medicine

## 2022-06-15 LAB — UA/M W/RFLX CULTURE, ROUTINE
Bilirubin, UA: NEGATIVE
Glucose, UA: NEGATIVE
Ketones, UA: NEGATIVE
Leukocytes,UA: NEGATIVE
Nitrite, UA: NEGATIVE
Protein,UA: NEGATIVE
RBC, UA: NEGATIVE
Specific Gravity, UA: 1.022 (ref 1.005–1.030)
Urobilinogen, Ur: 0.2 mg/dL (ref 0.2–1.0)
pH, UA: 5 (ref 5.0–7.5)

## 2022-06-15 LAB — MICROSCOPIC EXAMINATION
Bacteria, UA: NONE SEEN
Casts: NONE SEEN /lpf
RBC, Urine: NONE SEEN /hpf (ref 0–2)
WBC, UA: NONE SEEN /hpf (ref 0–5)

## 2022-06-17 ENCOUNTER — Ambulatory Visit (INDEPENDENT_AMBULATORY_CARE_PROVIDER_SITE_OTHER): Payer: BC Managed Care – PPO | Admitting: Nurse Practitioner

## 2022-06-17 ENCOUNTER — Encounter: Payer: Self-pay | Admitting: Nurse Practitioner

## 2022-06-17 VITALS — BP 138/84 | HR 75 | Temp 97.1°F | Resp 16 | Ht 66.0 in | Wt 125.2 lb

## 2022-06-17 DIAGNOSIS — R1319 Other dysphagia: Secondary | ICD-10-CM

## 2022-06-17 DIAGNOSIS — R042 Hemoptysis: Secondary | ICD-10-CM

## 2022-06-17 NOTE — Progress Notes (Signed)
[facility] [place], [place] [address]  Internal MEDICINE  Office Visit Note  Patient Name: Hannah Hurst  [id number]  [id number]  Date of Service: 06/17/2022  Chief Complaint  Patient presents with   Follow-up    Follow up- review swallow    HPI Hannah Hurst presents for a follow up visit to discuss swallow study results.  --normal except for a possible issue in the mid to distal esophagus and further testing with esophageal manometry is recommended. This kind of testing is done at [facility].    Current Medication: Outpatient Encounter Medications as of 06/17/2022  Medication Sig   betamethasone dipropionate (DIPROLENE) 0.05 % ointment Apply topically 2 (two) times daily.   busPIRone (BUSPAR) 15 MG tablet Take 15 mg by mouth 2 (two) times daily.   ergocalciferol (DRISDOL) 1.25 MG (50000 UT) capsule Take one cap q week   escitalopram (LEXAPRO) 20 MG tablet Take 20 mg by mouth daily.   No facility-administered encounter medications on file as of 06/17/2022.    Surgical History: Past Surgical History:  Procedure Laterality Date   ABDOMINAL HYSTERECTOMY     CHOLECYSTECTOMY     ilestomy reversal  2016   NASAL POLYP SURGERY     PANCREAS SURGERY     TOTAL KNEE ARTHROPLASTY Right 08/07/2014   dr Percell Miller   TOTAL KNEE ARTHROPLASTY Right 08/07/2014   Procedure: RIGHT TOTAL KNEE ARTHROPLASTY;  Surgeon: Ninetta Lights, MD;  Location: [facility];  Service: Orthopedics;  Laterality: Right;    Medical History: Past Medical History:  Diagnosis Date   Anxiety    adhd   Arthritis    PRIMARY GENERAL BILATERAL KNEES   Cancer ([place])    Family history of adverse reaction to anesthesia    MOTHER HAS NAUSEA   History of reversal of ileostomy 2016   Ileostomy in place ([place]) 2015   Lupus ([place])     Family History: Family History  Problem Relation Age of Onset   Varicose Veins Mother    Kidney disease Mother    Heart disease Mother    Diabetes Mother    Depression Mother     COPD Mother    Arthritis Mother    Stroke Father    Heart disease Father    Hypertension Father    Alzheimer's disease Father    Arthritis Sister    Hyperlipidemia Sister    Varicose Veins Sister    Arthritis Brother    Hypertension Brother    Hyperlipidemia Brother    Arthritis Son    Arthritis Maternal Aunt    COPD Maternal Aunt    Hyperlipidemia Maternal Aunt    Heart disease Maternal Aunt    Stroke Maternal Aunt    Lupus Maternal Aunt    Alzheimer's disease Paternal Grandmother    Alcohol abuse Paternal Grandfather     Social History   Socioeconomic History   Marital status: Divorced    Spouse name: Not on file   Number of children: Not on file   Years of education: Not on file   Highest education level: Not on file  Occupational History   Not on file  Tobacco Use   Smoking status: Every Day    Packs/day: 0.50    Years: 20.00    Total pack years: 10.00    Types: Cigarettes   Smokeless tobacco: Never   Tobacco comments:    2 a days 05/07/22  Substance and Sexual Activity   Alcohol use: Yes  Comment: social   Drug use: No   Sexual activity: Not on file  Other Topics Concern   Not on file  Social History Narrative   Not on file   Social Determinants of Health   Financial Resource Strain: Not on file  Food Insecurity: Not on file  Transportation Needs: Not on file  Physical Activity: Not on file  Stress: Not on file  Social Connections: Not on file  Intimate Partner Violence: Not on file      Review of Systems  Constitutional:  Negative for chills, fatigue and unexpected weight change.  HENT:  Negative for congestion, rhinorrhea, sneezing and sore throat.   Eyes:  Negative for redness.  Respiratory:  Negative for cough, chest tightness, shortness of breath and wheezing.   Cardiovascular:  Negative for chest pain and palpitations.  Gastrointestinal:  Negative for abdominal pain, constipation, diarrhea, nausea and vomiting.  Genitourinary:   Negative for dysuria and frequency.  Musculoskeletal:  Negative for arthralgias, back pain, joint swelling and neck pain.  Skin:  Negative for rash.  Neurological: Negative.  Negative for tremors and numbness.  Hematological:  Negative for adenopathy. Does not bruise/bleed easily.  Psychiatric/Behavioral:  Negative for behavioral problems (Depression), sleep disturbance and suicidal ideas. The patient is not nervous/anxious.     Vital Signs: BP 138/84   Pulse 75   Temp (!) 97.1 F (36.2 C)   Resp 16   Ht '5\' 6"'$  (1.676 m)   Wt 125 lb 3.2 oz (56.8 kg)   SpO2 99%   BMI 20.21 kg/m    Physical Exam Vitals reviewed.  Constitutional:      General: She is not in acute distress.    Appearance: Normal appearance. She is normal weight. She is not ill-appearing.  HENT:     Head: Normocephalic and atraumatic.  Eyes:     Pupils: Pupils are equal, round, and reactive to light.  Cardiovascular:     Rate and Rhythm: Normal rate and regular rhythm.  Pulmonary:     Effort: Pulmonary effort is normal. No respiratory distress.  Neurological:     Mental Status: She is alert and oriented to person, place, and time.  Psychiatric:        Mood and Affect: Mood normal.        Behavior: Behavior normal.        Assessment/Plan: 1. Esophageal dysphagia Swallow study show issues lower in the esophagus most likely, esophageal manometry is recommended, GI referral to [facility] ordered.  - Ambulatory referral to Gastroenterology   General Counseling: Hannah Hurst understanding of the findings of todays visit and agrees with plan of treatment. I have discussed any further diagnostic evaluation that may be needed or ordered today. We also reviewed her medications today. she has been encouraged to call the office with any questions or concerns that should arise related to todays visit.    Orders Placed This Encounter  Procedures   Ambulatory referral to Gastroenterology    No orders of the defined  types were placed in this encounter.   Return for F/U after esophageal manometry with DSK or Trevor Wilkie .   Total time spent:20 Minutes Time spent includes review of chart, medications, test results, and follow up plan with the patient.   [place] Controlled Substance Database was reviewed by me.  This patient was seen by Jonetta Osgood, FNP-C in collaboration with Dr. Clayborn Bigness as a part of collaborative care agreement.   Ezra Denne R. Valetta Fuller, MSN, FNP-C Internal medicine

## 2022-06-28 ENCOUNTER — Ambulatory Visit: Payer: BC Managed Care – PPO | Admitting: Nurse Practitioner

## 2022-07-29 ENCOUNTER — Ambulatory Visit
Admission: RE | Admit: 2022-07-29 | Discharge: 2022-07-29 | Disposition: A | Discharge status: Home / Self Care | Payer: BC Managed Care – PPO | Source: Ambulatory Visit | Attending: Physician Assistant | Admitting: Physician Assistant

## 2022-07-29 DIAGNOSIS — Z1231 Encounter for screening mammogram for malignant neoplasm of breast: Secondary | ICD-10-CM | POA: Insufficient documentation

## 2022-08-05 ENCOUNTER — Ambulatory Visit: Payer: BLUE CROSS/BLUE SHIELD | Admitting: Internal Medicine

## 2022-08-05 NOTE — Progress Notes (Deleted)
Office Visit Note  Patient: Hannah Hurst             Date of Birth: 11-May-1961           MRN: [id number]             PCP: Mylinda Latina, PA-C Referring: Mylinda Latina, PA* Visit Date: 08/05/2022 Occupation: '@GUAROCC'$ @  Subjective:  No chief complaint on file.   History of Present Illness: Hannah Hurst is a 61 y.o. female here for management of seropositive RA and lupus. Original diagnosis by skin biopsy in 2017 with chronic cutaneous lupus presentation. She had an allergic reaction to hydroxychloroquine but has been on treatment with methotrexate.***   Activities of Daily Living:  Patient reports morning stiffness for *** {minute/hour:19697}.   Patient {ACTIONS;DENIES/REPORTS:21021675::"Denies"} nocturnal pain.  Difficulty dressing/grooming: {ACTIONS;DENIES/REPORTS:21021675::"Denies"} Difficulty climbing stairs: {ACTIONS;DENIES/REPORTS:21021675::"Denies"} Difficulty getting out of chair: {ACTIONS;DENIES/REPORTS:21021675::"Denies"} Difficulty using hands for taps, buttons, cutlery, and/or writing: {ACTIONS;DENIES/REPORTS:21021675::"Denies"}  No Rheumatology ROS completed.   PMFS History:  Patient Active Problem List   Diagnosis Date Noted   DJD (degenerative joint disease) of knee 08/07/2014    Past Medical History:  Diagnosis Date   Anxiety    adhd   Arthritis    PRIMARY GENERAL BILATERAL KNEES   Cancer ([place])    Family history of adverse reaction to anesthesia    MOTHER HAS NAUSEA   History of reversal of ileostomy 2016   Ileostomy in place ([place]) 2015   Lupus ([place])     Family History  Problem Relation Age of Onset   Varicose Veins Mother    Kidney disease Mother    Heart disease Mother    Diabetes Mother    Depression Mother    COPD Mother    Arthritis Mother    Stroke Father    Heart disease Father    Hypertension Father    Alzheimer's disease Father    Arthritis Sister    Hyperlipidemia Sister    Varicose Veins Sister    Arthritis Brother     Hypertension Brother    Hyperlipidemia Brother    Arthritis Son    Arthritis Maternal Aunt    COPD Maternal Aunt    Hyperlipidemia Maternal Aunt    Heart disease Maternal Aunt    Stroke Maternal Aunt    Lupus Maternal Aunt    Alzheimer's disease Paternal Grandmother    Alcohol abuse Paternal Grandfather    Past Surgical History:  Procedure Laterality Date   ABDOMINAL HYSTERECTOMY     CHOLECYSTECTOMY     ilestomy reversal  2016   NASAL POLYP SURGERY     PANCREAS SURGERY     TOTAL KNEE ARTHROPLASTY Right 08/07/2014   dr Percell Miller   TOTAL KNEE ARTHROPLASTY Right 08/07/2014   Procedure: RIGHT TOTAL KNEE ARTHROPLASTY;  Surgeon: Ninetta Lights, MD;  Location: [facility];  Service: Orthopedics;  Laterality: Right;   Social History   Social History Narrative   Not on file   Immunization History  Administered Date(s) Administered   PFIZER(Purple Top)SARS-COV-2 Vaccination 01/07/2020, 01/17/2020, 06/27/2020     Objective: Vital Signs: There were no vitals taken for this visit.   Physical Exam   Musculoskeletal Exam: ***  CDAI Exam: CDAI Score: -- Patient Global: --; Provider Global: -- Swollen: --; Tender: -- Joint Exam 08/05/2022   No joint exam has been documented for this visit   There is currently no information documented on the homunculus. Go to the Rheumatology activity and complete the  homunculus joint exam.  Investigation: No additional findings.  Imaging: MM 3D SCREEN BREAST BILATERAL  Result Date: 08/02/2022 CLINICAL DATA:  Screening. EXAM: DIGITAL SCREENING BILATERAL MAMMOGRAM WITH TOMOSYNTHESIS AND CAD TECHNIQUE: Bilateral screening digital craniocaudal and mediolateral oblique mammograms were obtained. Bilateral screening digital breast tomosynthesis was performed. The images were evaluated with computer-aided detection. COMPARISON:  None available. ACR Breast Density Category c: The breast tissue is heterogeneously dense, which may obscure small masses FINDINGS:  There are no findings suspicious for malignancy. IMPRESSION: No mammographic evidence of malignancy. A result letter of this screening mammogram will be mailed directly to the patient. RECOMMENDATION: Screening mammogram in one year. (Code:SM-B-01Y) BI-RADS CATEGORY  1: Negative. Electronically Signed   By: Audie Pinto M.D.   On: 08/02/2022 13:12    Recent Labs: Lab Results  Component Value Date   WBC 7.5 06/10/2022   HGB 14.1 06/10/2022   PLT 263 06/10/2022   NA 140 06/10/2022   K 4.2 06/10/2022   CL 102 06/10/2022   CO2 22 06/10/2022   GLUCOSE 104 (H) 06/10/2022   BUN 17 06/10/2022   CREATININE 0.61 06/10/2022   BILITOT 0.6 06/10/2022   ALKPHOS 88 06/10/2022   AST 21 06/10/2022   ALT 14 06/10/2022   PROT 6.9 06/10/2022   ALBUMIN 5.1 (H) 06/10/2022   CALCIUM 9.6 06/10/2022   GFRAA >60 11/01/2014    Speciality Comments: No specialty comments available.  Procedures:  No procedures performed Allergies: Other   Assessment / Plan:     Visit Diagnoses: No diagnosis found.  Orders: No orders of the defined types were placed in this encounter.  No orders of the defined types were placed in this encounter.   Face-to-face time spent with patient was *** minutes. Greater than 50% of time was spent in counseling and coordination of care.  Follow-Up Instructions: No follow-ups on file.   Collier Salina, MD  Note - This record has been created using [employer].  Chart creation errors have been sought, but may not always  have been located. Such creation errors do not reflect on  the standard of medical care.

## 2022-08-07 ENCOUNTER — Encounter: Payer: Self-pay | Admitting: Nurse Practitioner

## 2022-08-07 DIAGNOSIS — R1319 Other dysphagia: Secondary | ICD-10-CM | POA: Insufficient documentation

## 2022-09-01 NOTE — Progress Notes (Unsigned)
Office Visit Note  Patient: Hannah Hurst             Date of Birth: 19-Aug-1961           MRN: [id number]             PCP: Mylinda Latina, PA-C Referring: Mylinda Latina, PA* Visit Date: 09/02/2022 Occupation: '@GUAROCC'$ @  Subjective:  No chief complaint on file.   History of Present Illness: Hannah Hurst is a 61 y.o. female here for evaluation and management of rheumatoid arthritis and lupus. She was previously treated with methotrexate apparently titrated to high dose for efficacy but developed some intolerance. She previously saw [facility] wit symptoms of inflammatory joint pain and rash with photosensitive dermatitis.***    *** atient was recently diagnosed with cutaneous lupus skin biopsy proven by [facility]. She developed a severe allergic reaction to Plaquenil, 72 hours with blisters on her feet/hands,it was discontinued immediately. Patient does not have a follow-up or any alternative therapeutic options offered by [facility]. History of skin rashes involving chest, upper arms, back, lower extremity for the past 20 years, associated with photosensitivity. No facial rash, no malar rash, no recurrent oronasal ulcers, no scalp alopecia. Denies any hematuria, no foamy urine, no clotting events, no DVT or stroke, history of pleural or pericardial effusion, no history of kidney failure. Her maternal aunt died from SLE in her early 27s. Denies any sicca manifestations, no Raynaud's. She is P2, G3 (1 pregnancy loss). She wears sunscreen and photo protective clothes. Denies any recurrent infection, no unintentional weight loss. Admits of fatigue since her ileostomy in 2015. Occasionally hands swell. Lab test November 2017 negative C-reactive protein, negative sed rate, hepatitis C antibody negative, normal CPK, serum creatinine 0.7, GFR 88.   Activities of Daily Living:  Patient reports morning stiffness for *** {minute/hour:19697}.   Patient  {ACTIONS;DENIES/REPORTS:21021675::"Denies"} nocturnal pain.  Difficulty dressing/grooming: {ACTIONS;DENIES/REPORTS:21021675::"Denies"} Difficulty climbing stairs: {ACTIONS;DENIES/REPORTS:21021675::"Denies"} Difficulty getting out of chair: {ACTIONS;DENIES/REPORTS:21021675::"Denies"} Difficulty using hands for taps, buttons, cutlery, and/or writing: {ACTIONS;DENIES/REPORTS:21021675::"Denies"}  No Rheumatology ROS completed.   PMFS History:  Patient Active Problem List   Diagnosis Date Noted   Esophageal dysphagia 08/07/2022   DJD (degenerative joint disease) of knee 08/07/2014    Past Medical History:  Diagnosis Date   Anxiety    adhd   Arthritis    PRIMARY GENERAL BILATERAL KNEES   Cancer ([place])    Family history of adverse reaction to anesthesia    MOTHER HAS NAUSEA   History of reversal of ileostomy 2016   Ileostomy in place ([place]) 2015   Lupus ([place])     Family History  Problem Relation Age of Onset   Varicose Veins Mother    Kidney disease Mother    Heart disease Mother    Diabetes Mother    Depression Mother    COPD Mother    Arthritis Mother    Stroke Father    Heart disease Father    Hypertension Father    Alzheimer's disease Father    Arthritis Sister    Hyperlipidemia Sister    Varicose Veins Sister    Arthritis Brother    Hypertension Brother    Hyperlipidemia Brother    Arthritis Son    Arthritis Maternal Aunt    COPD Maternal Aunt    Hyperlipidemia Maternal Aunt    Heart disease Maternal Aunt    Stroke Maternal Aunt    Lupus Maternal Aunt    Alzheimer's disease Paternal Grandmother  Alcohol abuse Paternal Grandfather    Past Surgical History:  Procedure Laterality Date   ABDOMINAL HYSTERECTOMY     CHOLECYSTECTOMY     ilestomy reversal  2016   NASAL POLYP SURGERY     PANCREAS SURGERY     TOTAL KNEE ARTHROPLASTY Right 08/07/2014   dr Percell Miller   TOTAL KNEE ARTHROPLASTY Right 08/07/2014   Procedure: RIGHT TOTAL KNEE ARTHROPLASTY;  Surgeon: Ninetta Lights, MD;  Location: [facility];  Service: Orthopedics;  Laterality: Right;   Social History   Social History Narrative   Not on file   Immunization History  Administered Date(s) Administered   PFIZER(Purple Top)SARS-COV-2 Vaccination 01/07/2020, 01/17/2020, 06/27/2020     Objective: Vital Signs: There were no vitals taken for this visit.   Physical Exam   Musculoskeletal Exam: ***  CDAI Exam: CDAI Score: -- Patient Global: --; Provider Global: -- Swollen: --; Tender: -- Joint Exam 09/02/2022   No joint exam has been documented for this visit   There is currently no information documented on the homunculus. Go to the Rheumatology activity and complete the homunculus joint exam.  Investigation: No additional findings.  Imaging: No results found.  Recent Labs: Lab Results  Component Value Date   WBC 7.5 06/10/2022   HGB 14.1 06/10/2022   PLT 263 06/10/2022   NA 140 06/10/2022   K 4.2 06/10/2022   CL 102 06/10/2022   CO2 22 06/10/2022   GLUCOSE 104 (H) 06/10/2022   BUN 17 06/10/2022   CREATININE 0.61 06/10/2022   BILITOT 0.6 06/10/2022   ALKPHOS 88 06/10/2022   AST 21 06/10/2022   ALT 14 06/10/2022   PROT 6.9 06/10/2022   ALBUMIN 5.1 (H) 06/10/2022   CALCIUM 9.6 06/10/2022   GFRAA >60 11/01/2014    Speciality Comments: No specialty comments available.  Procedures:  No procedures performed Allergies: Other   Assessment / Plan:     Visit Diagnoses: No diagnosis found.  Orders: No orders of the defined types were placed in this encounter.  No orders of the defined types were placed in this encounter.   Face-to-face time spent with patient was *** minutes. Greater than 50% of time was spent in counseling and coordination of care.  Follow-Up Instructions: No follow-ups on file.   Collier Salina, MD  Note - This record has been created using [employer].  Chart creation errors have been sought, but may not always  have been located. Such  creation errors do not reflect on  the standard of medical care.

## 2022-09-02 ENCOUNTER — Ambulatory Visit (INDEPENDENT_AMBULATORY_CARE_PROVIDER_SITE_OTHER): Payer: BC Managed Care – PPO

## 2022-09-02 ENCOUNTER — Ambulatory Visit: Payer: BC Managed Care – PPO

## 2022-09-02 ENCOUNTER — Ambulatory Visit: Payer: BC Managed Care – PPO | Attending: Internal Medicine | Admitting: Internal Medicine

## 2022-09-02 ENCOUNTER — Encounter: Payer: Self-pay | Admitting: Internal Medicine

## 2022-09-02 VITALS — BP 108/67 | HR 97 | Resp 16 | Ht 66.0 in | Wt 128.3 lb

## 2022-09-02 DIAGNOSIS — M79642 Pain in left hand: Secondary | ICD-10-CM | POA: Diagnosis not present

## 2022-09-02 DIAGNOSIS — L932 Other local lupus erythematosus: Secondary | ICD-10-CM

## 2022-09-02 DIAGNOSIS — M059 Rheumatoid arthritis with rheumatoid factor, unspecified: Secondary | ICD-10-CM

## 2022-09-02 DIAGNOSIS — M79671 Pain in right foot: Secondary | ICD-10-CM

## 2022-09-02 DIAGNOSIS — M79641 Pain in right hand: Secondary | ICD-10-CM

## 2022-09-02 DIAGNOSIS — M2550 Pain in unspecified joint: Secondary | ICD-10-CM

## 2022-09-02 DIAGNOSIS — Z79899 Other long term (current) drug therapy: Secondary | ICD-10-CM

## 2022-09-02 DIAGNOSIS — M79672 Pain in left foot: Secondary | ICD-10-CM | POA: Diagnosis not present

## 2022-09-04 LAB — CBC WITH DIFFERENTIAL/PLATELET
Absolute Monocytes: 636 cells/uL (ref 200–950)
Basophils Absolute: 32 cells/uL (ref 0–200)
Basophils Relative: 0.5 %
Eosinophils Absolute: 50 cells/uL (ref 15–500)
Eosinophils Relative: 0.8 %
HCT: 44.3 % (ref 35.0–45.0)
Hemoglobin: 14.9 g/dL (ref 11.7–15.5)
Lymphs Abs: 1550 cells/uL (ref 850–3900)
MCH: 30.5 pg (ref 27.0–33.0)
MCHC: 33.6 g/dL (ref 32.0–36.0)
MCV: 90.6 fL (ref 80.0–100.0)
MPV: 10.9 fL (ref 7.5–12.5)
Monocytes Relative: 10.1 %
Neutro Abs: 4032 cells/uL (ref 1500–7800)
Neutrophils Relative %: 64 %
Platelets: 343 10*3/uL (ref 140–400)
RBC: 4.89 10*6/uL (ref 3.80–5.10)
RDW: 12.9 % (ref 11.0–15.0)
Total Lymphocyte: 24.6 %
WBC: 6.3 10*3/uL (ref 3.8–10.8)

## 2022-09-04 LAB — QUANTIFERON-TB GOLD PLUS
Mitogen-NIL: >10 IU/mL
NIL: 0.03 IU/mL
QuantiFERON-TB Gold Plus: NEGATIVE
TB1-NIL: 0 IU/mL
TB2-NIL: 0.01 IU/mL

## 2022-09-04 LAB — COMPLETE METABOLIC PANEL WITH GFR
AG Ratio: 2.1 (calc) (ref 1.0–2.5)
ALT: 13 U/L (ref 6–29)
AST: 23 U/L (ref 10–35)
Albumin: 4.8 g/dL (ref 3.6–5.1)
Alkaline phosphatase (APISO): 71 U/L (ref 37–153)
BUN: 19 mg/dL (ref 7–25)
CO2: 27 mmol/L (ref 20–32)
Calcium: 10.1 mg/dL (ref 8.6–10.4)
Chloride: 103 mmol/L (ref 98–110)
Creat: 0.68 mg/dL (ref 0.50–1.03)
Globulin: 2.3 g/dL (calc) (ref 1.9–3.7)
Glucose, Bld: 102 mg/dL — ABNORMAL HIGH (ref 65–99)
Potassium: 4.6 mmol/L (ref 3.5–5.3)
Sodium: 140 mmol/L (ref 135–146)
Total Bilirubin: 0.8 mg/dL (ref 0.2–1.2)
Total Protein: 7.1 g/dL (ref 6.1–8.1)
eGFR: 100 mL/min/{1.73_m2} (ref >=60)

## 2022-09-04 LAB — SJOGRENS SYNDROME-A EXTRACTABLE NUCLEAR ANTIBODY: SSA (Ro) (ENA) Antibody, IgG: <1 AI

## 2022-09-04 LAB — ANTI-DNA ANTIBODY, DOUBLE-STRANDED: ds DNA Ab: <1 IU/mL

## 2022-09-04 LAB — C-REACTIVE PROTEIN: CRP: 0.7 mg/L (ref <8.0)

## 2022-09-04 LAB — SEDIMENTATION RATE: Sed Rate: 2 mm/h (ref 0–30)

## 2022-09-04 LAB — C3 AND C4
C3 Complement: 117 mg/dL (ref 83–193)
C4 Complement: 21 mg/dL (ref 15–57)

## 2022-09-06 NOTE — Progress Notes (Signed)
Lab results look okay with no current markers of inflammation increased. Negative for sjogrens syndrome and lupus antibody tests. Xrays show osteoarthritis with no erosive RA damage.

## 2022-09-07 ENCOUNTER — Encounter: Payer: Self-pay | Admitting: Internal Medicine

## 2022-09-07 ENCOUNTER — Ambulatory Visit (INDEPENDENT_AMBULATORY_CARE_PROVIDER_SITE_OTHER): Payer: BC Managed Care – PPO | Admitting: Internal Medicine

## 2022-09-07 DIAGNOSIS — E2839 Other primary ovarian failure: Secondary | ICD-10-CM | POA: Diagnosis not present

## 2022-09-07 DIAGNOSIS — R5382 Chronic fatigue, unspecified: Secondary | ICD-10-CM | POA: Diagnosis not present

## 2022-09-07 DIAGNOSIS — L930 Discoid lupus erythematosus: Secondary | ICD-10-CM | POA: Diagnosis not present

## 2022-09-07 MED ORDER — ESTRADIOL 0.05 MG/24HR TD PTWK: MEDICATED_PATCH | TRANSDERMAL | 12 refills | Status: DC

## 2022-09-07 MED ORDER — DULOXETINE HCL 20 MG PO CPEP: 20.0000 mg | ORAL_CAPSULE | Freq: Every day | ORAL | 3 refills | Status: DC

## 2022-09-07 NOTE — Progress Notes (Signed)
[facility] [place], [place] [address]  Internal MEDICINE  Office Visit Note  Patient Name: Hannah Hurst  [id number]  [id number]  Date of Service: 09/07/2022  Chief Complaint  Patient presents with   Follow-up   Quality Metric Gaps    Patient still needs TDAP, Flu and Shingles vaccines, as well as a Colonoscopy and a Pap    HPI  Pt is here for routine follow up Patient has complains of chronic fatigue, dry skin and feeling of foggy in her brain Patient developed pancreatitis and all her medications were stopped including HRT She has GI follow-up for ongoing GI complaints Patient used to be on methotrexate however since she has been off of it has not been feeling well she has been having chronic fatigue arthralgias, supposed to see rheumatology for further management   Current Medication: Outpatient Encounter Medications as of 09/07/2022  Medication Sig   betamethasone dipropionate (DIPROLENE) 0.05 % ointment Apply topically 2 (two) times daily.   DULoxetine (CYMBALTA) 20 MG capsule Take 1 capsule (20 mg total) by mouth daily.   ergocalciferol (DRISDOL) 1.25 MG (50000 UT) capsule Take one cap q week   estradiol (CLIMARA - DOSED IN MG/24 HR) 0.05 mg/24hr patch Apply one patch 2 x week   Perfluorohexyloctane (MIEBO OP) Apply 3 mLs to eye in the morning, at noon, in the evening, and at bedtime. Use 2 drops per eye 4x daily.   No facility-administered encounter medications on file as of 09/07/2022.    Surgical History: Past Surgical History:  Procedure Laterality Date   ABDOMINAL HYSTERECTOMY     CHOLECYSTECTOMY     ilestomy reversal  2016   NASAL POLYP SURGERY     PANCREAS SURGERY     TOTAL KNEE ARTHROPLASTY Right 08/07/2014   dr Percell Miller   TOTAL KNEE ARTHROPLASTY Right 08/07/2014   Procedure: RIGHT TOTAL KNEE ARTHROPLASTY;  Surgeon: Ninetta Lights, MD;  Location: [facility];  Service: Orthopedics;  Laterality: Right;    Medical History: Past  Medical History:  Diagnosis Date   Anxiety    adhd   Arthritis    PRIMARY GENERAL BILATERAL KNEES   Cancer ([place])    Family history of adverse reaction to anesthesia    MOTHER HAS NAUSEA   History of reversal of ileostomy 2016   Ileostomy in place ([place]) 2015   Lupus ([place])     Family History: Family History  Problem Relation Age of Onset   Varicose Veins Mother    Kidney disease Mother    Heart disease Mother    Diabetes Mother    Depression Mother    COPD Mother    Arthritis Mother    Stroke Father    Heart disease Father    Hypertension Father    Alzheimer's disease Father    Arthritis Sister    Hyperlipidemia Sister    Varicose Veins Sister    Arthritis Brother    Hypertension Brother    Hyperlipidemia Brother    Arthritis Son    Arthritis Maternal Aunt    COPD Maternal Aunt    Hyperlipidemia Maternal Aunt    Heart disease Maternal Aunt    Stroke Maternal Aunt    Lupus Maternal Aunt    Alzheimer's disease Paternal Grandmother    Alcohol abuse Paternal Grandfather     Social History   Socioeconomic History   Marital status: Divorced    Spouse name: Not on file   Number of children: Not on  file   Years of education: Not on file   Highest education level: Not on file  Occupational History   Not on file  Tobacco Use   Smoking status: Every Day    Packs/day: 0.50    Years: 20.00    Total pack years: 10.00    Types: Cigarettes   Smokeless tobacco: Never   Tobacco comments:    Smokes approx. 4 cigarettes daily.  Substance and Sexual Activity   Alcohol use: Yes    Comment: social   Drug use: No   Sexual activity: Not on file  Other Topics Concern   Not on file  Social History Narrative   Not on file   Social Determinants of Health   Financial Resource Strain: Not on file  Food Insecurity: Not on file  Transportation Needs: Not on file  Physical Activity: Not on file  Stress: Not on file  Social Connections: Not on file  Intimate Partner  Violence: Not on file      Review of Systems  Constitutional:  Positive for fatigue. Negative for chills and unexpected weight change.  HENT:  Negative for congestion, postnasal drip, rhinorrhea, sneezing and sore throat.   Eyes:  Negative for redness.  Respiratory:  Negative for cough, chest tightness and shortness of breath.   Cardiovascular:  Negative for chest pain and palpitations.  Gastrointestinal:  Negative for abdominal pain, constipation, diarrhea, nausea and vomiting.  Genitourinary:  Negative for dysuria and frequency.  Musculoskeletal:  Positive for arthralgias. Negative for back pain, joint swelling and neck pain.  Skin:  Negative for rash.  Neurological: Negative.  Negative for tremors and numbness.  Hematological:  Negative for adenopathy. Does not bruise/bleed easily.  Psychiatric/Behavioral:  Positive for dysphoric mood. Negative for behavioral problems (Depression), sleep disturbance and suicidal ideas. The patient is not nervous/anxious.     Vital Signs: BP 105/69   Pulse 68   Temp 97.6 F (36.4 C)   Resp 16   Ht '5\' 6"'$  (1.676 m)   Wt 124 lb 9.6 oz (56.5 kg)   SpO2 99%   BMI 20.11 kg/m    Physical Exam Constitutional:      Appearance: Normal appearance.  HENT:     Head: Normocephalic and atraumatic.     Nose: Nose normal.     Mouth/Throat:     Mouth: Mucous membranes are moist.     Pharynx: No posterior oropharyngeal erythema.  Eyes:     Extraocular Movements: Extraocular movements intact.     Pupils: Pupils are equal, round, and reactive to light.  Cardiovascular:     Pulses: Normal pulses.     Heart sounds: Normal heart sounds.  Pulmonary:     Effort: Pulmonary effort is normal.     Breath sounds: Normal breath sounds.  Neurological:     General: No focal deficit present.     Mental Status: She is alert.  Psychiatric:        Mood and Affect: Mood normal.        Behavior: Behavior normal.        Assessment/Plan: 1. Other primary  ovarian failure Will restart her HRT, will reevaluate decrease or increase the medication in couple of months  - DG Bone Density; Future - estradiol (CLIMARA - DOSED IN MG/24 HR) 0.05 mg/24hr patch; Apply one patch 2 x week  Dispense: 8 patch; Refill: 12  2. Chronic fatigue Most likely as a result of connective tissue disease discoid lupus will start low-dose Cymbalta -  DULoxetine (CYMBALTA) 20 MG capsule; Take 1 capsule (20 mg total) by mouth daily.  Dispense: 30 capsule; Refill: 3  3. Chronic discoid lupus erythematosus Patient has follow-up with rheumatology for further treatment  General Counseling: Shaquan verbalizes understanding of the findings of todays visit and agrees with plan of treatment. I have discussed any further diagnostic evaluation that may be needed or ordered today. We also reviewed her medications today. she has been encouraged to call the office with any questions or concerns that should arise related to todays visit.    Orders Placed This Encounter  Procedures   DG Bone Density    Meds ordered this encounter  Medications   estradiol (CLIMARA - DOSED IN MG/24 HR) 0.05 mg/24hr patch    Sig: Apply one patch 2 x week    Dispense:  8 patch    Refill:  12   DULoxetine (CYMBALTA) 20 MG capsule    Sig: Take 1 capsule (20 mg total) by mouth daily.    Dispense:  30 capsule    Refill:  3    Total time spent:35 Minutes Time spent includes review of chart, medications, test results, and follow up plan with the patient.   [place] Controlled Substance Database was reviewed by me.   Dr Lavera Guise Internal medicine

## 2022-09-08 ENCOUNTER — Other Ambulatory Visit: Payer: Self-pay

## 2022-09-08 MED ORDER — ESTRADIOL 0.05 MG/24HR TD PTWK: 0.0500 mg | MEDICATED_PATCH | TRANSDERMAL | 6 refills | Status: DC

## 2022-09-08 NOTE — Telephone Encounter (Signed)
As per dr Humphrey Rolls send pres estradiol patch once a weekly

## 2022-09-15 ENCOUNTER — Other Ambulatory Visit: Payer: BLUE CROSS/BLUE SHIELD

## 2022-09-23 ENCOUNTER — Ambulatory Visit
Admission: RE | Admit: 2022-09-23 | Discharge: 2022-09-23 | Disposition: A | Discharge status: Home / Self Care | Payer: BC Managed Care – PPO | Source: Ambulatory Visit | Attending: Internal Medicine | Admitting: Internal Medicine

## 2022-09-23 DIAGNOSIS — E2839 Other primary ovarian failure: Secondary | ICD-10-CM | POA: Insufficient documentation

## 2022-09-23 DIAGNOSIS — M85852 Other specified disorders of bone density and structure, left thigh: Secondary | ICD-10-CM | POA: Diagnosis not present

## 2022-09-27 DIAGNOSIS — M8580 Other specified disorders of bone density and structure, unspecified site: Secondary | ICD-10-CM | POA: Insufficient documentation

## 2022-10-06 ENCOUNTER — Ambulatory Visit: Payer: BC Managed Care – PPO | Attending: Internal Medicine | Admitting: Internal Medicine

## 2022-10-06 ENCOUNTER — Encounter: Payer: Self-pay | Admitting: Internal Medicine

## 2022-10-06 VITALS — BP 114/75 | HR 100 | Resp 12 | Ht 66.0 in | Wt 122.0 lb

## 2022-10-06 DIAGNOSIS — M2550 Pain in unspecified joint: Secondary | ICD-10-CM | POA: Diagnosis not present

## 2022-10-06 DIAGNOSIS — L932 Other local lupus erythematosus: Secondary | ICD-10-CM | POA: Diagnosis not present

## 2022-10-06 DIAGNOSIS — M059 Rheumatoid arthritis with rheumatoid factor, unspecified: Secondary | ICD-10-CM

## 2022-10-06 NOTE — Progress Notes (Signed)
Office Visit Note  Patient: Hannah Hurst             Date of Birth: 01-27-1961           MRN: [id number]             PCP: Mylinda Latina, PA-C Referring: Mylinda Latina, PA* Visit Date: 10/06/2022   Subjective:   History of Present Illness: Hannah Hurst is a 62 y.o. female here for follow up for arthritis and lupus history.  Labs checked at our initial visit were entirely normal with no particular evidence of systemic lupus antibody test for serum inflammatory markers.  X-rays of the hands and feet showed mild to moderate osteoarthritis throughout worst at the first [facility] joint of the hands.  Since our initial visit symptoms have been about the same no increased areas of skin rash.  Still having some joint pain and stiffness in multiple areas pulse about the same as before.  Previous HPI 09/02/22 Hannah Hurst is a 62 y.o. female here for evaluation and management of rheumatoid arthritis and lupus. She was previously treated with methotrexate apparently titrated to high dose for efficacy but experienced repeated issues with pain and swelling in the area around her injection sites especially at the lower abdomen.  She developed diffuse skin rashes reaction to hydroxychloroquine as initially tried medication.  She previously [facility] with symptoms of inflammatory joint pain and rash with photosensitive dermatitis.  Prior to that she had had ongoing rashes including photosensitivity features ongoing for decades.  She had skin biopsy with findings consistent for cutaneous lupus.  Subsequently she saw [place] [facility] for management as well mostly seeing Marella Chimes but no regular follow up since 2020 due to lack of health insurance.  She also felt disease activity was pretty low or in remission even off of treatment until in the past year she has been experiencing worsening of numerous health problems.  She is experienced increased joint pain and swelling particularly involving their joints  on both hands and both feet with some prolonged morning stiffness.  She is experiencing skin rashes most commonly on the upper back and torso that are different than her usual.  She had an episode of hemoptysis this summer was evaluated at the hospital and with pulmonology clinic follow-up without a specific mechanism identified.  Of note pulmonology opinion thought less likely for lupus or connective tissue disease associated inflammation in the lungs. She denies symptoms of oral or nasal ulcers, lymphadenopathy, Raynaud's, history of blood clots.  She does have chronic dry eye symptoms recently was started on Miebo drops.   07/2016 HBV neg HCV neg   Review of Systems  Constitutional:  Positive for fatigue.  HENT:  Negative for mouth sores and mouth dryness.   Eyes:  Positive for dryness.  Respiratory:  Positive for shortness of breath.   Cardiovascular:  Negative for chest pain and palpitations.  Gastrointestinal:  Positive for constipation and diarrhea. Negative for blood in stool.  Endocrine: Negative for increased urination.  Genitourinary:  Positive for involuntary urination.  Musculoskeletal:  Positive for joint pain, gait problem, joint pain, joint swelling, muscle weakness, morning stiffness and muscle tenderness. Negative for myalgias and myalgias.  Skin:  Positive for rash and sensitivity to sunlight. Negative for color change and hair loss.  Allergic/Immunologic: Positive for susceptible to infections.  Neurological:  Positive for dizziness. Negative for headaches.  Hematological:  Negative for swollen glands.  Psychiatric/Behavioral:  Negative for  depressed mood and sleep disturbance. The patient is nervous/anxious.     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   Chronic fatigue 07/07/2016   Cutaneous lupus erythematosus 07/07/2016   Depression, major, in remission ([place]) 07/07/2016   History of malignant neoplasm of vulva 07/07/2016   Hypercholesterolemia 07/07/2016   PUD (peptic ulcer disease) 07/07/2016   DJD (degenerative joint disease) of knee 08/07/2014    Past Medical History:  Diagnosis Date   Anxiety    adhd   Arthritis    PRIMARY GENERAL BILATERAL KNEES   Cancer ([place])    Family history of adverse reaction to anesthesia    MOTHER HAS NAUSEA   History of reversal of ileostomy 2016   Ileostomy in place ([place]) 2015   Lupus ([place])    Osteopenia     Family History  Problem Relation Age of Onset   Varicose Veins Mother    Kidney disease Mother    Heart disease Mother    Diabetes Mother    Depression Mother    COPD Mother    Arthritis Mother    Stroke Father    Heart disease Father    Hypertension Father    Alzheimer's disease Father    Arthritis Sister    Hyperlipidemia Sister    Varicose Veins Sister    Arthritis Brother    Hypertension Brother    Hyperlipidemia Brother    Arthritis Son    Arthritis Maternal Aunt    COPD Maternal Aunt    Hyperlipidemia Maternal Aunt    Heart disease Maternal Aunt    Stroke Maternal Aunt    Lupus Maternal Aunt    Alzheimer's disease Paternal Grandmother    Alcohol abuse Paternal Grandfather    Past Surgical History:  Procedure Laterality Date   ABDOMINAL HYSTERECTOMY     CHOLECYSTECTOMY     ilestomy reversal  2016   NASAL POLYP SURGERY     PANCREAS SURGERY     TOTAL KNEE ARTHROPLASTY Right 08/07/2014   dr Percell Miller   TOTAL KNEE ARTHROPLASTY Right 08/07/2014   Procedure: RIGHT TOTAL KNEE ARTHROPLASTY;  Surgeon: Ninetta Lights, MD;  Location: [place];  Service: Orthopedics;  Laterality: Right;   Social History   Social History Narrative   Not on file   Immunization History  Administered Date(s)  Administered   Influenza-Unspecified 07/11/2016   PFIZER(Purple Top)SARS-COV-2 Vaccination 01/07/2020, 01/17/2020, 06/27/2020     Objective: Vital Signs: BP 114/75 (BP Location: Left Arm, Patient Position: Sitting, Cuff Size: Small)   Pulse 100   Resp 12   Ht '5\' 6"'$  (1.676 m)   Wt 122 lb (55.3 kg)   BMI 19.69 kg/m    Physical Exam Eyes:     Conjunctiva/sclera: Conjunctivae normal.  Cardiovascular:     Rate and Rhythm: Normal rate and regular rhythm.  Pulmonary:     Effort: Pulmonary effort is normal.     Breath sounds: Normal breath sounds.  Lymphadenopathy:     Cervical: No cervical adenopathy.  Skin:    General: Skin is warm and dry.  Neurological:     Mental Status: She is alert.  Psychiatric:        Mood and Affect: Mood normal.      Musculoskeletal  Exam:  Shoulders full ROM no tenderness or swelling Elbows full ROM no tenderness or swelling Wrists full ROM no tenderness or swelling Fingers Bouchard and Heberden's nodes present, tenderness to pressure only around the first [facility] joint Knees full ROM no tenderness or swelling   Investigation: No additional findings.  Imaging: DG Bone Density  Result Date: 09/23/2022 EXAM: DUAL X-RAY ABSORPTIOMETRY (DXA) FOR BONE MINERAL DENSITY IMPRESSION: Your patient Hannah Hurst completed a BMD test on 09/23/2022 using the [place] (analysis version: 14.10) manufactured by [employer]. The following summarizes the results of our evaluation. Technologist: LCE PATIENT BIOGRAPHICAL: Name: Sunday, Klos Patient ID: [id number] Birth Date: 12/24/1960 Height: 65.5 in. Gender: Female Exam Date: 09/23/2022 Weight: 123.4 lbs. Indications: Caucasian, Hysterectomy, Knee Replacement Right, Lupus, Oophorectomy Bilateral, POSTmenopausal, Rheumatoid Arthritis, History of Fracture (Adult) Fractures: Elbow Right Treatments: Vitamin D ASSESSMENT: The BMD measured at Femur Total Left is 0.749 g/cm2 with a T-score of -2.1. This  patient is considered osteopenic according to [facility]) criteria. L-2 was excluded due to degenerative changes. The scan quality is good. Site Region Measured Measured WHO Young Adult BMD Date       Age      Classification T-score AP Spine L1-L4 (L2) 09/23/2022 59.8 Normal -0.5 1.127 g/cm2 DualFemur Total Left 09/23/2022 59.8 Osteopenia -2.1 0.749 g/cm2 World Health Organization [facility]) criteria for post-menopausal, Caucasian Women: Normal:       T-score at or above -1 SD Osteopenia:   T-score between -1 and -2.5 SD Osteoporosis: T-score at or below -2.5 SD RECOMMENDATIONS: 1. All patients should optimize calcium and vitamin D intake. 2. Consider FDA-approved medical therapies in postmenopausal women and men aged 54 years and older, based on the following: a. A hip or vertebral (clinical or morphometric) fracture b. T-score < -2.5 at the femoral neck or spine after appropriate evaluation to exclude secondary causes c. Low bone mass (T-score between -1.0 and -2.5 at the femoral neck or spine) and a 10-year probability of a hip fracture > 3% or a 10-year probability of a major osteoporosis-related fracture > 20% based on the [place]-adapted WHO algorithm d. Clinician judgment and/or patient preferences may indicate treatment for people with 10-year fracture probabilities above or below these levels FOLLOW-UP: People with diagnosed cases of osteoporosis or at high risk for fracture should have regular bone mineral density tests. For patients eligible for Medicare, routine testing is allowed once every 2 years. The testing frequency can be increased to one year for patients who have rapidly progressing disease, those who are receiving or discontinuing medical therapy to restore bone mass, or have additional risk factors. I have reviewed this report, and agree with the above findings. [facility] Radiology Your patient ZARRA GEFFERT completed a FRAX assessment on 09/23/2022 using the [place] (analysis version: 14.10) manufactured by [employer]. The following summarizes the results of our evaluation. Technologist: LCE PATIENT BIOGRAPHICAL: Name: Hannah Hurst, Hannah Hurst Patient ID: [id number] Birth Date: 09/12/1961 Height:    65.5 in. Gender:     Female    [age]       Weight:    123.4 lbs. Ethnicity:  White                            Exam Date: 09/23/2022 FRAX* RESULTS:  (version: 3.5) 10-year Probability of Fracture1 Major Osteoporotic Fracture2 Hip Fracture 14.9% 1.4%  Population: [place] (Caucasian) Risk Factors: Rheumatoid Arthritis, History of Fracture (Adult) Based on Femur (Left) Neck BMD 1 -The 10-year probability of fracture may be lower than reported if the patient has received treatment. 2 -Major Osteoporotic Fracture: Clinical Spine, Forearm, Hip or Shoulder *FRAX is a Materials engineer of the [employer] for Metabolic Bone Disease, a [place] (WHO) [facility]. ASSESSMENT: The probability of a major osteoporotic fracture is 14.9% within the next ten years. The probability of a hip fracture is 1.4% within the next ten years. Electronically Signed   By: Zerita Boers M.D.   On: 09/23/2022 14:46    Recent Labs: Lab Results  Component Value Date   WBC 6.3 09/02/2022   HGB 14.9 09/02/2022   PLT 343 09/02/2022   NA 140 09/02/2022   K 4.6 09/02/2022   CL 103 09/02/2022   CO2 27 09/02/2022   GLUCOSE 102 (H) 09/02/2022   BUN 19 09/02/2022   CREATININE 0.68 09/02/2022   BILITOT 0.8 09/02/2022   ALKPHOS 88 06/10/2022   AST 23 09/02/2022   ALT 13 09/02/2022   PROT 7.1 09/02/2022   ALBUMIN 5.1 (H) 06/10/2022   CALCIUM 10.1 09/02/2022   GFRAA >60 11/01/2014   QFTBGOLDPLUS NEGATIVE 09/02/2022    Speciality Comments: No specialty comments available.  Procedures:  No procedures performed Allergies: Other and Hydroxychloroquine   Assessment / Plan:     Visit Diagnoses: Cutaneous lupus erythematosus  Previous confirmed  disease including biopsy but no current evidence of active skin inflammation.  Her labs are reassuring with no specific indicators of systemic lupus positive.  Reviewed basic principles with maintaining good vitamin D level and UV light exposure and triggers for lupus.  I recommend we have a scheduled follow-up in the summertime of this year where lupus skin disease is most likely to be exacerbated to determine if any maintenance treatment is needed.   Seropositive rheumatoid arthritis ([place])  No current signs concerning for active inflammatory arthritis with normal labs and no peripheral joint synovitis appreciated on exam today.  Multiple joint pain  Current joint pain issues appear to be related to her mild to moderate osteoarthritis in multiple areas noted on x-rays of both hands and both feet.  Printed some recommendation on symptomatic management and supplement treatments for additional options to try as needed.  Orders: No orders of the defined types were placed in this encounter.  No orders of the defined types were placed in this encounter.    Follow-Up Instructions: Return in about 6 months (around 04/06/2023) for OA/CCLE obs f/u 52mo.   CCollier Salina MD  Note - This record has been created using [employer]  Chart creation errors have been sought, but may not always  have been located. Such creation errors do not reflect on  the standard of medical care.

## 2022-10-06 NOTE — Patient Instructions (Signed)

## 2022-10-18 ENCOUNTER — Ambulatory Visit (INDEPENDENT_AMBULATORY_CARE_PROVIDER_SITE_OTHER): Payer: BC Managed Care – PPO | Admitting: Gastroenterology

## 2022-10-18 ENCOUNTER — Encounter: Payer: Self-pay | Admitting: Gastroenterology

## 2022-10-18 ENCOUNTER — Ambulatory Visit (INDEPENDENT_AMBULATORY_CARE_PROVIDER_SITE_OTHER): Payer: BC Managed Care – PPO | Admitting: Physician Assistant

## 2022-10-18 ENCOUNTER — Encounter: Payer: Self-pay | Admitting: Physician Assistant

## 2022-10-18 VITALS — BP 128/81 | HR 101 | Temp 98.2°F | Ht 65.5 in | Wt 122.0 lb

## 2022-10-18 VITALS — BP 106/86 | HR 74 | Temp 98.4°F | Resp 16 | Ht 66.0 in | Wt 126.6 lb

## 2022-10-18 DIAGNOSIS — R09A2 Foreign body sensation, throat: Secondary | ICD-10-CM | POA: Diagnosis not present

## 2022-10-18 DIAGNOSIS — L930 Discoid lupus erythematosus: Secondary | ICD-10-CM | POA: Diagnosis not present

## 2022-10-18 DIAGNOSIS — R5382 Chronic fatigue, unspecified: Secondary | ICD-10-CM | POA: Diagnosis not present

## 2022-10-18 DIAGNOSIS — R110 Nausea: Secondary | ICD-10-CM | POA: Diagnosis not present

## 2022-10-18 DIAGNOSIS — R7309 Other abnormal glucose: Secondary | ICD-10-CM

## 2022-10-18 DIAGNOSIS — E559 Vitamin D deficiency, unspecified: Secondary | ICD-10-CM | POA: Diagnosis not present

## 2022-10-18 DIAGNOSIS — Z1211 Encounter for screening for malignant neoplasm of colon: Secondary | ICD-10-CM

## 2022-10-18 DIAGNOSIS — R1310 Dysphagia, unspecified: Secondary | ICD-10-CM

## 2022-10-18 MED ORDER — PANTOPRAZOLE SODIUM 40 MG PO TBEC: 40.0000 mg | DELAYED_RELEASE_TABLET | Freq: Every day | ORAL | 5 refills | Status: DC

## 2022-10-18 MED ORDER — NA SULFATE-K SULFATE-MG SULF 17.5-3.13-1.6 GM/177ML PO SOLN: 1.0000 | Freq: Once | ORAL | 0 refills | Status: AC

## 2022-10-18 NOTE — Progress Notes (Signed)
[facility] [place], [place] [address]  Internal MEDICINE  Office Visit Note  Patient Name: Hannah Hurst  [id number]  [id number]  Date of Service: 10/18/2022  Chief Complaint  Patient presents with   Follow-up    HPI Pt is here for routine follow up -estradiol Patch caused skin irritation and wouldn't stay on very well. Went through all the patches because they kept falling off.  -started cymbalta and vit D and thinks these are helping, unsure if estrogen really helped for the little while it stayed on or if just due to cymbalta and vit D. Denies hot flashes/night sweats, just had fatigue. Will therefore fold off on any other estrogen supplement and stop patches, especially since patient is currently still smoking 8 cigs per day--is working on quitting in the next week, -had a hysterectomy in her 32s -osteopenia and is supplementing Vit D.  -Seeing GI today -Seeing rheumatology, doesn't want to be on methotrexate so is holding off on this right now. Mainly OA now. Doing PT for hands and back now and this is helping  Current Medication: Outpatient Encounter Medications as of 10/18/2022  Medication Sig   betamethasone dipropionate (DIPROLENE) 0.05 % ointment Apply topically 2 (two) times daily.   DULoxetine (CYMBALTA) 20 MG capsule Take 1 capsule (20 mg total) by mouth daily.   ergocalciferol (DRISDOL) 1.25 MG (50000 UT) capsule Take one cap q week   Perfluorohexyloctane (MIEBO OP) Apply 3 mLs to eye in the morning, at noon, in the evening, and at bedtime. Use 2 drops per eye 4x daily.   [DISCONTINUED] estradiol (CLIMARA - DOSED IN MG/24 HR) 0.05 mg/24hr patch Place 1 patch (0.05 mg total) onto the skin once a week.   No facility-administered encounter medications on file as of 10/18/2022.    Surgical History: Past Surgical History:  Procedure Laterality Date   ABDOMINAL HYSTERECTOMY     CHOLECYSTECTOMY     ilestomy reversal  2016   NASAL POLYP SURGERY      PANCREAS SURGERY     TOTAL KNEE ARTHROPLASTY Right 08/07/2014   dr Percell Miller   TOTAL KNEE ARTHROPLASTY Right 08/07/2014   Procedure: RIGHT TOTAL KNEE ARTHROPLASTY;  Surgeon: Ninetta Lights, MD;  Location: [place];  Service: Orthopedics;  Laterality: Right;    Medical History: Past Medical History:  Diagnosis Date   Anxiety    adhd   Arthritis    PRIMARY GENERAL BILATERAL KNEES   Cancer ([place])    Family history of adverse reaction to anesthesia    MOTHER HAS NAUSEA   History of reversal of ileostomy 2016   Ileostomy in place ([place]) 2015   Lupus ([place])    Osteopenia     Family History: Family History  Problem Relation Age of Onset   Varicose Veins Mother    Kidney disease Mother    Heart disease Mother    Diabetes Mother    Depression Mother    COPD Mother    Arthritis Mother    Stroke Father    Heart disease Father    Hypertension Father    Alzheimer's disease Father    Arthritis Sister    Hyperlipidemia Sister    Varicose Veins Sister    Arthritis Brother    Hypertension Brother    Hyperlipidemia Brother    Arthritis Son    Arthritis Maternal Aunt    COPD Maternal Aunt    Hyperlipidemia Maternal Aunt    Heart disease Maternal Aunt  Stroke Maternal Aunt    Lupus Maternal Aunt    Alzheimer's disease Paternal Grandmother    Alcohol abuse Paternal Grandfather     Social History   Socioeconomic History   Marital status: Divorced    Spouse name: Not on file   Number of children: Not on file   Years of education: Not on file   Highest education level: Not on file  Occupational History   Not on file  Tobacco Use   Smoking status: Every Day    Packs/day: 0.50    Years: 20.00    Total pack years: 10.00    Types: Cigarettes   Smokeless tobacco: Never   Tobacco comments:    Smokes approx. 8 cigarettes daily.  Vaping Use   Vaping Use: Former  Substance and Sexual Activity   Alcohol use: Yes    Comment: social   Drug use: No   Sexual activity: Not on  file  Other Topics Concern   Not on file  Social History Narrative   Not on file   Social Determinants of Health   Financial Resource Strain: Not on file  Food Insecurity: Not on file  Transportation Needs: Not on file  Physical Activity: Not on file  Stress: Not on file  Social Connections: Not on file  Intimate Partner Violence: Not on file      Review of Systems  Constitutional:  Negative for chills, fatigue and unexpected weight change.  HENT:  Negative for congestion, rhinorrhea, sneezing and sore throat.   Eyes:  Negative for redness.  Respiratory:  Negative for cough, chest tightness, shortness of breath and wheezing.   Cardiovascular:  Negative for chest pain and palpitations.  Gastrointestinal:  Negative for abdominal pain, constipation, diarrhea, nausea and vomiting.  Genitourinary:  Negative for dysuria and frequency.  Musculoskeletal:  Positive for arthralgias. Negative for back pain, joint swelling and neck pain.  Skin:  Negative for rash.  Neurological: Negative.  Negative for tremors and numbness.  Hematological:  Negative for adenopathy. Does not bruise/bleed easily.  Psychiatric/Behavioral:  Negative for behavioral problems (Depression), sleep disturbance and suicidal ideas. The patient is not nervous/anxious.     Vital Signs: BP 106/86   Pulse 74   Temp 98.4 F (36.9 C)   Resp 16   Ht '5\' 6"'$  (1.676 m)   Wt 126 lb 9.6 oz (57.4 kg)   SpO2 99%   BMI 20.43 kg/m    Physical Exam Constitutional:      Appearance: Normal appearance.  HENT:     Head: Normocephalic and atraumatic.     Nose: Nose normal.     Mouth/Throat:     Mouth: Mucous membranes are moist.     Pharynx: No posterior oropharyngeal erythema.  Eyes:     Extraocular Movements: Extraocular movements intact.     Pupils: Pupils are equal, round, and reactive to light.  Cardiovascular:     Rate and Rhythm: Normal rate and regular rhythm.     Pulses: Normal pulses.     Heart sounds:  Normal heart sounds.  Pulmonary:     Effort: Pulmonary effort is normal.     Breath sounds: Normal breath sounds.  Neurological:     General: No focal deficit present.     Mental Status: She is alert.  Psychiatric:        Mood and Affect: Mood normal.        Behavior: Behavior normal.        Assessment/Plan: 1. Chronic fatigue  Improving with cymbalta and vit D and will continue  2. Vitamin D deficiency Will rehceck labs in 3 months - VITAMIN D 25 Hydroxy (Vit-D Deficiency, Fractures)  3. 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 I have discussed any further diagnostic evaluation that may be needed or ordered today. We also reviewed her medications today. she has been encouraged to call the office with any questions or concerns that should arise related to todays visit.    Orders Placed This Encounter  Procedures   VITAMIN D 25 Hydroxy (Vit-D Deficiency, Fractures)   Hgb A1C w/o eAG    No orders of the defined types were placed in this encounter.   This patient was seen by Drema Dallas, PA-C in collaboration with Dr. Clayborn Bigness as a part of collaborative care agreement.   Total time spent:30 Minutes Time spent includes review of chart, medications, test results, and follow up plan with the patient.      Dr Lavera Guise Internal medicine

## 2022-10-18 NOTE — Progress Notes (Signed)
Gastroenterology Consultation  Referring Provider:     Mylinda Latina, PA* Primary Care Physician:  Mylinda Latina, PA-C Primary Gastroenterologist:  Dr. Allen Norris     Reason for Consultation:     Nausea and need for colonoscopy        HPI:   Hannah Hurst is a 62 y.o. y/o female referred for consultation & management of nausea and need for colonoscopy by Dr. Chauncey Cruel, Si Gaul, PA-C.  This patient comes in today with a history of having a colostomy with reversal which appears to have been from an abscess from a diverticular perforation.  The patient had seen me in 2016 and is now coming back to me for the nausea and not having a recent colonoscopy.  It was reported from the PCP that the patient had pancreatitis and all of her medications were stopped.  The patient also has a history of lupus and is followed by rheumatology. She says the nausea has been since her her reversal of her colostomy. She reports that she has had blood form her mouth coming up 6 months ago and she was not vomiting it up. The modified barium swallow showed reflux. The patient reports that she has not had any unexplained weight loss but reports that she has not been able to gain weight since the reversal of her colostomy.  The patient also reports that her nausea is mostly in the morning.  She denies any heartburn symptoms although her modified barium swallow showed reflux. The patient had a modified barium swallow due to a feeling of fullness in her throat.   Past Medical History:  Diagnosis Date   Anxiety    adhd   Arthritis    PRIMARY GENERAL BILATERAL KNEES   Cancer ([place])    Family history of adverse reaction to anesthesia    MOTHER HAS NAUSEA   History of reversal of ileostomy 2016   Ileostomy in place ([place]) 2015   Lupus ([place])    Osteopenia     Past Surgical History:  Procedure Laterality Date   ABDOMINAL HYSTERECTOMY     CHOLECYSTECTOMY     ilestomy reversal  2016   NASAL POLYP SURGERY      PANCREAS SURGERY     TOTAL KNEE ARTHROPLASTY Right 08/07/2014   dr Percell Miller   TOTAL KNEE ARTHROPLASTY Right 08/07/2014   Procedure: RIGHT TOTAL KNEE ARTHROPLASTY;  Surgeon: Ninetta Lights, MD;  Location: [facility];  Service: Orthopedics;  Laterality: Right;    Prior to Admission medications   Medication Sig Start Date End Date Taking? Authorizing Provider  betamethasone dipropionate (DIPROLENE) 0.05 % ointment Apply topically 2 (two) times daily.    [provider]  DULoxetine (CYMBALTA) 20 MG capsule Take 1 capsule (20 mg total) by mouth daily. 09/07/22   Lavera Guise, MD  ergocalciferol (DRISDOL) 1.25 MG (50000 UT) capsule Take one cap q week 06/14/22   McDonough, Si Gaul, PA-C  Perfluorohexyloctane (MIEBO OP) Apply 3 mLs to eye in the morning, at noon, in the evening, and at bedtime. Use 2 drops per eye 4x daily.    [provider]    Family History  Problem Relation Age of Onset   Varicose Veins Mother    Kidney disease Mother    Heart disease Mother    Diabetes Mother    Depression Mother    COPD Mother    Arthritis Mother    Stroke Father    Heart disease Father  Hypertension Father    Alzheimer's disease Father    Arthritis Sister    Hyperlipidemia Sister    Varicose Veins Sister    Arthritis Brother    Hypertension Brother    Hyperlipidemia Brother    Arthritis Son    Arthritis Maternal Aunt    COPD Maternal Aunt    Hyperlipidemia Maternal Aunt    Heart disease Maternal Aunt    Stroke Maternal Aunt    Lupus Maternal Aunt    Alzheimer's disease Paternal Grandmother    Alcohol abuse Paternal Grandfather      Social History   Tobacco Use   Smoking status: Every Day    Packs/day: 0.50    Years: 20.00    Total pack years: 10.00    Types: Cigarettes   Smokeless tobacco: Never   Tobacco comments:    Smokes approx. 8 cigarettes daily.  Vaping Use   Vaping Use: Former  Substance Use Topics   Alcohol use: Yes    Comment: social   Drug use: No     Allergies as of 10/18/2022 - Review Complete 10/18/2022  Allergen Reaction Noted   Other  09/24/2018   Hydroxychloroquine Rash 08/06/2016    Review of Systems:    All systems reviewed and negative except where noted in HPI.   Physical Exam:  There were no vitals taken for this visit. No LMP recorded. Patient has had a hysterectomy. General:   Alert,  Well-developed, well-nourished, pleasant and cooperative in NAD Head:  Normocephalic and atraumatic. Eyes:  Sclera clear, no icterus.   Conjunctiva pink. Ears:  Normal auditory acuity. Neck:  Supple; no masses or thyromegaly. Lungs:  Respirations even and unlabored.  Clear throughout to auscultation.   No wheezes, crackles, or rhonchi. No acute distress. Heart:  Regular rate and rhythm; no murmurs, clicks, rubs, or gallops. Abdomen:  Normal bowel sounds.  No bruits.  Soft, non-tender and non-distended without masses, hepatosplenomegaly or hernias noted.  No guarding or rebound tenderness.  Negative Carnett sign.   Rectal:  Deferred.  Pulses:  Normal pulses noted. Extremities:  No clubbing or edema.  No cyanosis. Neurologic:  Alert and oriented x3;  grossly normal neurologically. Skin:  Intact without significant lesions or rashes.  No jaundice. Lymph Nodes:  No significant cervical adenopathy. Psych:  Alert and cooperative. Normal mood and affect.  Imaging Studies: DG Bone Density  Result Date: 09/23/2022 EXAM: DUAL X-RAY ABSORPTIOMETRY (DXA) FOR BONE MINERAL DENSITY IMPRESSION: Your patient Hannah Hurst completed a BMD test on 09/23/2022 using the [place] (analysis version: 14.10) manufactured by [employer]. The following summarizes the results of our evaluation. Technologist: LCE PATIENT BIOGRAPHICAL: Name: Hannah Hurst, Hannah Hurst Patient ID: [id number] Birth Date: [birth year] Height: 65.5 in. Gender: Female Exam Date: 09/23/2022 Weight: 123.4 lbs. Indications: Caucasian, Hysterectomy, Knee Replacement Right, Lupus,  Oophorectomy Bilateral, POSTmenopausal, Rheumatoid Arthritis, History of Fracture (Adult) Fractures: Elbow Right Treatments: Vitamin D ASSESSMENT: The BMD measured at Femur Total Left is 0.749 g/cm2 with a T-score of -2.1. This patient is considered osteopenic according to [facility]) criteria. L-2 was excluded due to degenerative changes. The scan quality is good. Site Region Measured Measured WHO Young Adult BMD Date       Age      Classification T-score AP Spine L1-L4 (L2) 09/23/2022 59.8 Normal -0.5 1.127 g/cm2 DualFemur Total Left 09/23/2022 59.8 Osteopenia -2.1 0.749 g/cm2 World Health Organization [facility]) criteria for post-menopausal, Caucasian Women: Normal:  T-score at or above -1 SD Osteopenia:   T-score between -1 and -2.5 SD Osteoporosis: T-score at or below -2.5 SD RECOMMENDATIONS: 1. All patients should optimize calcium and vitamin D intake. 2. Consider FDA-approved medical therapies in postmenopausal women and men aged 68 years and older, based on the following: a. A hip or vertebral (clinical or morphometric) fracture b. T-score < -2.5 at the femoral neck or spine after appropriate evaluation to exclude secondary causes c. Low bone mass (T-score between -1.0 and -2.5 at the femoral neck or spine) and a 10-year probability of a hip fracture > 3% or a 10-year probability of a major osteoporosis-related fracture > 20% based on the [place]-adapted WHO algorithm d. Clinician judgment and/or patient preferences may indicate treatment for people with 10-year fracture probabilities above or below these levels FOLLOW-UP: People with diagnosed cases of osteoporosis or at high risk for fracture should have regular bone mineral density tests. For patients eligible for Medicare, routine testing is allowed once every 2 years. The testing frequency can be increased to one year for patients who have rapidly progressing disease, those who are receiving or discontinuing medical therapy to restore  bone mass, or have additional risk factors. I have reviewed this report, and agree with the above findings. Hannah Hurst Your patient Hannah Hurst completed a FRAX assessment on 09/23/2022 using the [place] (analysis version: 14.10) manufactured by [employer]. The following summarizes the results of our evaluation. Technologist: LCE PATIENT BIOGRAPHICAL: Name: Hannah Hurst, Hannah Hurst Patient ID: [id number] Birth Date: 1961/03/17 Height:    65.5 in. Gender:     Female    [age]       Weight:    123.4 lbs. Ethnicity:  White                            Exam Date: 09/23/2022 FRAX* RESULTS:  (version: 3.5) 10-year Probability of Fracture1 Major Osteoporotic Fracture2 Hip Fracture 14.9% 1.4% Population: [place] (Caucasian) Risk Factors: Rheumatoid Arthritis, History of Fracture (Adult) Based on Femur (Left) Neck BMD 1 -The 10-year probability of fracture may be lower than reported if the patient has received treatment. 2 -Major Osteoporotic Fracture: Clinical Spine, Forearm, Hip or Shoulder *FRAX is a Materials engineer of the [employer] for Metabolic Bone Disease, a [place] (WHO) [facility]. ASSESSMENT: The probability of a major osteoporotic fracture is 14.9% within the next ten years. The probability of a hip fracture is 1.4% within the next ten years. Electronically Signed   By: Zerita Boers M.D.   On: 09/23/2022 14:46    Assessment and Plan:   Hannah Hurst is a 62 y.o. y/o female who comes in today with a history of diverticulitis with an abscess and colonic resection with reversal of a colostomy.  The patient is due for a colonoscopy for screening purposes.  The patient also will be set up for an upper endoscopy due to her chronic nausea and globus sensation.  The patient will also be started on pantoprazole 40 mg in the evening due to the nausea that is most prominent in the morning.  The patient has been explained the plan  agrees with it.    Lucilla Lame, MD. Marval Regal    Note: This dictation was prepared with Dragon dictation along with smaller phrase technology. Any transcriptional errors that result from this process are unintentional.

## 2022-10-25 ENCOUNTER — Encounter: Payer: Self-pay | Admitting: Gastroenterology

## 2022-10-27 DIAGNOSIS — M41127 Adolescent idiopathic scoliosis, lumbosacral region: Secondary | ICD-10-CM | POA: Diagnosis not present

## 2022-10-27 DIAGNOSIS — M5416 Radiculopathy, lumbar region: Secondary | ICD-10-CM | POA: Diagnosis not present

## 2022-10-27 DIAGNOSIS — M9903 Segmental and somatic dysfunction of lumbar region: Secondary | ICD-10-CM | POA: Diagnosis not present

## 2022-10-27 DIAGNOSIS — M62830 Muscle spasm of back: Secondary | ICD-10-CM | POA: Diagnosis not present

## 2022-10-29 ENCOUNTER — Ambulatory Visit: Payer: BC Managed Care – PPO | Admitting: Anesthesiology

## 2022-10-29 ENCOUNTER — Other Ambulatory Visit: Payer: Self-pay

## 2022-10-29 ENCOUNTER — Encounter
Admission: RE | Disposition: A | Discharge status: Home / Self Care | Payer: Self-pay | Source: Home / Self Care | Attending: Gastroenterology

## 2022-10-29 ENCOUNTER — Ambulatory Visit
Admission: RE | Admit: 2022-10-29 | Discharge: 2022-10-29 | Disposition: A | Discharge status: Home / Self Care | Payer: BC Managed Care – PPO | Attending: Gastroenterology | Admitting: Gastroenterology

## 2022-10-29 ENCOUNTER — Encounter: Payer: Self-pay | Admitting: Gastroenterology

## 2022-10-29 DIAGNOSIS — Z79899 Other long term (current) drug therapy: Secondary | ICD-10-CM | POA: Insufficient documentation

## 2022-10-29 DIAGNOSIS — K449 Diaphragmatic hernia without obstruction or gangrene: Secondary | ICD-10-CM | POA: Diagnosis not present

## 2022-10-29 DIAGNOSIS — K640 First degree hemorrhoids: Secondary | ICD-10-CM | POA: Diagnosis not present

## 2022-10-29 DIAGNOSIS — R1310 Dysphagia, unspecified: Secondary | ICD-10-CM | POA: Diagnosis not present

## 2022-10-29 DIAGNOSIS — D123 Benign neoplasm of transverse colon: Secondary | ICD-10-CM | POA: Insufficient documentation

## 2022-10-29 DIAGNOSIS — F17210 Nicotine dependence, cigarettes, uncomplicated: Secondary | ICD-10-CM | POA: Insufficient documentation

## 2022-10-29 DIAGNOSIS — D124 Benign neoplasm of descending colon: Secondary | ICD-10-CM | POA: Diagnosis not present

## 2022-10-29 DIAGNOSIS — K635 Polyp of colon: Secondary | ICD-10-CM

## 2022-10-29 DIAGNOSIS — F419 Anxiety disorder, unspecified: Secondary | ICD-10-CM | POA: Diagnosis not present

## 2022-10-29 DIAGNOSIS — M329 Systemic lupus erythematosus, unspecified: Secondary | ICD-10-CM | POA: Diagnosis not present

## 2022-10-29 DIAGNOSIS — M170 Bilateral primary osteoarthritis of knee: Secondary | ICD-10-CM | POA: Insufficient documentation

## 2022-10-29 DIAGNOSIS — M069 Rheumatoid arthritis, unspecified: Secondary | ICD-10-CM | POA: Insufficient documentation

## 2022-10-29 DIAGNOSIS — R12 Heartburn: Secondary | ICD-10-CM | POA: Insufficient documentation

## 2022-10-29 DIAGNOSIS — R110 Nausea: Secondary | ICD-10-CM | POA: Diagnosis not present

## 2022-10-29 DIAGNOSIS — Z1211 Encounter for screening for malignant neoplasm of colon: Secondary | ICD-10-CM | POA: Diagnosis not present

## 2022-10-29 DIAGNOSIS — M8580 Other specified disorders of bone density and structure, unspecified site: Secondary | ICD-10-CM | POA: Diagnosis not present

## 2022-10-29 HISTORY — PX: ESOPHAGOGASTRODUODENOSCOPY (EGD) WITH PROPOFOL: SHX5813

## 2022-10-29 HISTORY — PX: COLONOSCOPY WITH PROPOFOL: SHX5780

## 2022-10-29 SURGERY — COLONOSCOPY WITH PROPOFOL
Anesthesia: General | Site: Rectum

## 2022-10-29 MED ORDER — ACETAMINOPHEN 160 MG/5ML PO SOLN: 325.0000 mg | ORAL | Status: DC | PRN

## 2022-10-29 MED ORDER — STERILE WATER FOR IRRIGATION IR SOLN
Status: DC | PRN
  Administered 2022-10-29: 150 mL

## 2022-10-29 MED ORDER — SODIUM CHLORIDE 0.9 % IV SOLN: INTRAVENOUS | Status: DC

## 2022-10-29 MED ORDER — LIDOCAINE HCL (CARDIAC) PF 100 MG/5ML IV SOSY
PREFILLED_SYRINGE | INTRAVENOUS | Status: DC | PRN
  Administered 2022-10-29: 70 mg via INTRAVENOUS

## 2022-10-29 MED ORDER — PROPOFOL 10 MG/ML IV BOLUS
INTRAVENOUS | Status: DC | PRN
  Administered 2022-10-29: 40 mg via INTRAVENOUS
  Administered 2022-10-29 (×2): 20 mg via INTRAVENOUS
  Administered 2022-10-29: 60 mg via INTRAVENOUS
  Administered 2022-10-29: 30 mg via INTRAVENOUS
  Administered 2022-10-29: 80 mg via INTRAVENOUS
  Administered 2022-10-29: 50 mg via INTRAVENOUS

## 2022-10-29 MED ORDER — LACTATED RINGERS IV SOLN: INTRAVENOUS | Status: DC

## 2022-10-29 MED ORDER — ACETAMINOPHEN 325 MG PO TABS: 650.0000 mg | ORAL_TABLET | Freq: Once | ORAL | Status: DC | PRN

## 2022-10-29 MED ORDER — ONDANSETRON HCL 4 MG/2ML IJ SOLN: 4.0000 mg | Freq: Once | INTRAMUSCULAR | Status: DC | PRN

## 2022-10-29 SURGICAL SUPPLY — 36 items
BALLN DILATOR 12-15 8 (BALLOONS)
BALLN DILATOR 15-18 8 (BALLOONS)
BALLN DILATOR CRE 0-12 8 (BALLOONS)
BALLN DILATOR ESOPH 8 10 CRE (MISCELLANEOUS) IMPLANT
BALLOON DILATOR 12-15 8 (BALLOONS) IMPLANT
BALLOON DILATOR 15-18 8 (BALLOONS) IMPLANT
BALLOON DILATOR CRE 0-12 8 (BALLOONS) IMPLANT
BLOCK BITE 60FR ADLT L/F GRN (MISCELLANEOUS) ×2 IMPLANT
CLIP HMST 235XBRD CATH ROT (MISCELLANEOUS) IMPLANT
CLIP RESOLUTION 360 11X235 (MISCELLANEOUS)
ELECT REM PT RETURN 9FT ADLT (ELECTROSURGICAL)
ELECTRODE REM PT RTRN 9FT ADLT (ELECTROSURGICAL) IMPLANT
FCP ESCP3.2XJMB 240X2.8X (MISCELLANEOUS)
FORCEPS BIOP RAD 4 LRG CAP 4 (CUTTING FORCEPS) IMPLANT
FORCEPS BIOP RJ4 240 W/NDL (MISCELLANEOUS)
FORCEPS ESCP3.2XJMB 240X2.8X (MISCELLANEOUS) IMPLANT
GOWN CVR UNV OPN BCK APRN NK (MISCELLANEOUS) ×8 IMPLANT
GOWN ISOL THUMB LOOP REG UNIV (MISCELLANEOUS) ×8
INJECTOR VARIJECT VIN23 (MISCELLANEOUS) IMPLANT
KIT DEFENDO VALVE AND CONN (KITS) IMPLANT
KIT PRC NS LF DISP ENDO (KITS) ×4 IMPLANT
KIT PROCEDURE OLYMPUS (KITS) ×4
MANIFOLD NEPTUNE II (INSTRUMENTS) ×4 IMPLANT
MARKER SPOT ENDO TATTOO 5ML (MISCELLANEOUS) IMPLANT
PROBE APC STR FIRE (PROBE) IMPLANT
RETRIEVER NET PLAT FOOD (MISCELLANEOUS) IMPLANT
RETRIEVER NET ROTH 2.5X230 LF (MISCELLANEOUS) IMPLANT
SNARE COLD EXACTO (MISCELLANEOUS) IMPLANT
SNARE SHORT THROW 13M SML OVAL (MISCELLANEOUS) IMPLANT
SNARE SHORT THROW 30M LRG OVAL (MISCELLANEOUS) IMPLANT
SNARE SNG USE RND 15MM (INSTRUMENTS) IMPLANT
SYR INFLATION 60ML (SYRINGE) IMPLANT
TRAP ETRAP POLY (MISCELLANEOUS) IMPLANT
VARIJECT INJECTOR VIN23 (MISCELLANEOUS)
WATER STERILE IRR 250ML POUR (IV SOLUTION) ×4 IMPLANT
WIRE CRE 18-20MM 8CM F G (MISCELLANEOUS) IMPLANT

## 2022-10-29 NOTE — Op Note (Signed)
[facility] Gastroenterology Patient Name: Hannah Hurst Procedure Date: 10/29/2022 9:32 AM MRN: [id number] Account #: [id number] Date of Birth: May 13, 1961 Admit Type: Outpatient Age: 62 Room: [facility] OR ROOM 01 Gender: Female Note Status: Finalized Instrument Name: [id number] Procedure:             Colonoscopy Indications:           Screening for colorectal malignant neoplasm Providers:             Lucilla Lame MD, MD Referring MD:          Mylinda Latina (Referring MD) Medicines:             Propofol per Anesthesia Complications:         No immediate complications. Procedure:             Pre-Anesthesia Assessment:                        - Prior to the procedure, a History and Physical was                         performed, and patient medications and allergies were                         reviewed. The patient's tolerance of previous                         anesthesia was also reviewed. The risks and benefits                         of the procedure and the sedation options and risks                         were discussed with the patient. All questions were                         answered, and informed consent was obtained. Prior                         Anticoagulants: The patient has taken no anticoagulant                         or antiplatelet agents. ASA Grade Assessment: II - A                         patient with mild systemic disease. After reviewing                         the risks and benefits, the patient was deemed in                         satisfactory condition to undergo the procedure.                        After obtaining informed consent, the colonoscope was                         passed under direct vision. Throughout the procedure,  the patient's blood pressure, pulse, and oxygen                         saturations were monitored continuously. The                         Colonoscope was introduced through the anus and                          advanced to the the cecum, identified by appendiceal                         orifice and ileocecal valve. The colonoscopy was                         performed without difficulty. The patient tolerated                         the procedure well. The quality of the bowel                         preparation was excellent. Findings:      The perianal and digital rectal examinations were normal.      A 4 mm polyp was found in the transverse colon. The polyp was sessile.       The polyp was removed with a cold snare. Resection and retrieval were       complete.      A 4 mm polyp was found in the descending colon. The polyp was sessile.       The polyp was removed with a cold snare. Resection and retrieval were       complete.      Non-bleeding internal hemorrhoids were found during retroflexion. The       hemorrhoids were Grade I (internal hemorrhoids that do not prolapse). Impression:            - One 4 mm polyp in the transverse colon, removed with                         a cold snare. Resected and retrieved.                        - One 4 mm polyp in the descending colon, removed with                         a cold snare. Resected and retrieved.                        - Non-bleeding internal hemorrhoids. Recommendation:        - Discharge patient to home.                        - Resume previous diet.                        - Continue present medications.                        - Await pathology results.                        -  If the pathology report reveals adenomatous tissue,                         then repeat the colonoscopy for surveillance in 7                         years. Procedure Code(s):     --- Professional ---                        [phone], Colonoscopy, flexible; with removal of                         tumor(s), polyp(s), or other lesion(s) by snare                         technique Diagnosis Code(s):     --- Professional ---                         Z12.11, Encounter for screening for malignant neoplasm                         of colon                        D12.3, Benign neoplasm of transverse colon (hepatic                         flexure or splenic flexure) CPT copyright 2022 American Medical Association. All rights reserved. The codes documented in this report are preliminary and upon coder review may  be revised to meet current compliance requirements. Lucilla Lame MD, MD 10/29/2022 10:10:10 AM This report has been signed electronically. Number of Addenda: 0 Note Initiated On: 10/29/2022 9:32 AM Scope Withdrawal Time: 0 hours 9 minutes 43 seconds  Total Procedure Duration: 0 hours 15 minutes 42 seconds  Estimated Blood Loss:  Estimated blood loss: none.      [facility]

## 2022-10-29 NOTE — Op Note (Signed)
[facility] Gastroenterology Patient Name: Hannah Hurst Procedure Date: 10/29/2022 9:36 AM MRN: [id number] Account #: [id number] Date of Birth: April 07, 1961 Admit Type: Outpatient Age: 62 Room: [facility] OR ROOM 1 Gender: Female Note Status: Finalized Instrument Name: [id number] Procedure:             Upper GI endoscopy Indications:           Heartburn Providers:             Lucilla Lame MD, MD Referring MD:          Mylinda Latina (Referring MD) Medicines:             Propofol per Anesthesia Complications:         No immediate complications. Procedure:             Pre-Anesthesia Assessment:                        - Prior to the procedure, a History and Physical was                         performed, and patient medications and allergies were                         reviewed. The patient's tolerance of previous                         anesthesia was also reviewed. The risks and benefits                         of the procedure and the sedation options and risks                         were discussed with the patient. All questions were                         answered, and informed consent was obtained. Prior                         Anticoagulants: The patient has taken no anticoagulant                         or antiplatelet agents. ASA Grade Assessment: II - A                         patient with mild systemic disease. After reviewing                         the risks and benefits, the patient was deemed in                         satisfactory condition to undergo the procedure.                        After obtaining informed consent, the endoscope was                         passed under direct vision. Throughout the procedure,  the patient's blood pressure, pulse, and oxygen                         saturations were monitored continuously. The Endoscope                         was introduced through the mouth, and advanced to the                          second part of duodenum. The upper GI endoscopy was                         accomplished without difficulty. The patient tolerated                         the procedure well. Findings:      A small hiatal hernia was present.      The stomach was normal.      The examined duodenum was normal. Impression:            - Small hiatal hernia.                        - Normal stomach.                        - Normal examined duodenum.                        - No specimens collected. Recommendation:        - Discharge patient to home.                        - Resume previous diet.                        - Continue present medications.                        - Perform a colonoscopy today. Procedure Code(s):     --- Professional ---                        [phone], Esophagogastroduodenoscopy, flexible,                         transoral; diagnostic, including collection of                         specimen(s) by brushing or washing, when performed                         (separate procedure) Diagnosis Code(s):     --- Professional ---                        R12, Heartburn CPT copyright 2022 American Medical Association. All rights reserved. The codes documented in this report are preliminary and upon coder review may  be revised to meet current compliance requirements. Lucilla Lame MD, MD 10/29/2022 9:52:39 AM This report has been signed electronically. Number of Addenda: 0 Note Initiated On: 10/29/2022 9:36 AM Total Procedure Duration: 0 hours 1 minute 50 seconds  Estimated Blood Loss:  Estimated blood loss: none.      [facility]

## 2022-10-29 NOTE — Anesthesia Postprocedure Evaluation (Signed)
Anesthesia Post Note  Patient: Hannah Hurst  Procedure(s) Performed: COLONOSCOPY WITH PROPOFOL (Rectum) ESOPHAGOGASTRODUODENOSCOPY (EGD) WITH PROPOFOL (Mouth)  Patient location during evaluation: PACU Anesthesia Type: General Level of consciousness: awake and alert, oriented and patient cooperative Pain management: pain level controlled Vital Signs Assessment: post-procedure vital signs reviewed and stable Respiratory status: spontaneous breathing, nonlabored ventilation and respiratory function stable Cardiovascular status: blood pressure returned to baseline and stable Postop Assessment: adequate PO intake Anesthetic complications: no   No notable events documented.   Last Vitals:  Vitals:   10/29/22 1015 10/29/22 1022  BP: 106/75 111/73  Pulse: 71 80  Resp: 19 15  Temp:  36.5 C  SpO2: 100% 100%    Last Pain:  Vitals:   10/29/22 1022  TempSrc:   PainSc: 0-No pain                 Darrin Nipper

## 2022-10-29 NOTE — Anesthesia Preprocedure Evaluation (Addendum)
Anesthesia Evaluation  Patient identified by MRN, date of birth, ID band Patient awake    Reviewed: Allergy & Precautions, NPO status , Patient's Chart, lab work & pertinent test results  History of Anesthesia Complications Negative for: history of anesthetic complications  Airway Mallampati: III   Neck ROM: Full    Dental  (+) Missing, Chipped Crowns :   Pulmonary Current Smoker (6-8 cigarettes per day) and Patient abstained from smoking.   Pulmonary exam normal breath sounds clear to auscultation       Cardiovascular Exercise Tolerance: Good Normal cardiovascular exam Rhythm:Regular Rate:Normal  ECG 03/13/22:  Normal sinus rhythm Right atrial enlargement Nonspecific ST abnormality Abnormal ECG When compared with ECG of 30-Jul-2014 09:46, No significant change was found   Neuro/Psych  PSYCHIATRIC DISORDERS Anxiety     negative neurological ROS     GI/Hepatic negative GI ROS,,,  Endo/Other  negative endocrine ROS    Renal/GU negative Renal ROS     Musculoskeletal  (+) Arthritis , Rheumatoid disorders,  Lupus    Abdominal   Peds  Hematology negative hematology ROS (+)   Anesthesia Other Findings   Reproductive/Obstetrics                             Anesthesia Physical Anesthesia Plan  ASA: 2  Anesthesia Plan: General   Post-op Pain Management:    Induction: Intravenous  PONV Risk Score and Plan: 2 and Propofol infusion, TIVA and Treatment may vary due to age or medical condition  Airway Management Planned: Natural Airway  Additional Equipment:   Intra-op Plan:   Post-operative Plan:   Informed Consent: I have reviewed the patients History and Physical, chart, labs and discussed the procedure including the risks, benefits and alternatives for the proposed anesthesia with the patient or authorized representative who has indicated his/her understanding and acceptance.        Plan Discussed with: CRNA  Anesthesia Plan Comments: (LMA/GETA backup discussed.  Patient consented for risks of anesthesia including but not limited to:  - adverse reactions to medications - damage to eyes, teeth, lips or other oral mucosa - nerve damage due to positioning  - sore throat or hoarseness - damage to heart, brain, nerves, lungs, other parts of body or loss of life  Informed patient about role of CRNA in peri- and intra-operative care.  Patient voiced understanding.)        Anesthesia Quick Evaluation

## 2022-10-29 NOTE — Transfer of Care (Signed)
Immediate Anesthesia Transfer of Care Note  Patient: Hannah Hurst  Procedure(s) Performed: COLONOSCOPY WITH PROPOFOL (Rectum) ESOPHAGOGASTRODUODENOSCOPY (EGD) WITH PROPOFOL (Mouth)  Patient Location: PACU  Anesthesia Type: General  Level of Consciousness: awake, alert  and patient cooperative  Airway and Oxygen Therapy: Patient Spontanous Breathing and Patient connected to supplemental oxygen  Post-op Assessment: Post-op Vital signs reviewed, Patient's Cardiovascular Status Stable, Respiratory Function Stable, Patent Airway and No signs of Nausea or vomiting  Post-op Vital Signs: Reviewed and stable  Complications: No notable events documented.

## 2022-10-29 NOTE — H&P (Signed)
Hannah Lame, MD [address] [place], [place] [address] Phone:[phone] Fax : [phone]  Primary Care Physician:  Hannah Latina, Hur[facility] Primary Gastroenterologist:  Dr. Allen Hurst  Pre-Procedure History & Physical: HPI:  Hannah Hurst is a 62 y.o. female is here for an endoscopy and colonoscopy.   Past Medical History:  Diagnosis Date   Anxiety    adhd   Arthritis    PRIMARY GENERAL BILATERAL KNEES   Cancer ([facility])    Family history of adverse reaction to anesthesia    MOTHER HAS NAUSEA   History of reversal of ileostomy 2016   Ileostomy in place ([facility]) 2015   Lupus ([place])    Osteopenia     Past Surgical History:  Procedure Laterality Date   ABDOMINAL HYSTERECTOMY     CHOLECYSTECTOMY     ilestomy reversal  2016   NASAL POLYP SURGERY     PANCREAS SURGERY     TOTAL KNEE ARTHROPLASTY Right 08/07/2014   dr Hannah Hurst   TOTAL KNEE ARTHROPLASTY Right 08/07/2014   Procedure: RIGHT TOTAL KNEE ARTHROPLASTY;  Surgeon: Hannah Lights, MD;  Location: [facility];  Service: Orthopedics;  Laterality: Right;    Prior to Admission medications   Medication Sig Start Date End Date Taking? Authorizing Provider  betamethasone dipropionate (DIPROLENE) 0.05 % ointment Apply topically 2 (two) times daily.   Yes [provider]  DULoxetine (CYMBALTA) 20 MG capsule Take 1 capsule (20 mg total) by mouth daily. 09/07/22  Yes Hannah Guise, MD  ergocalciferol (DRISDOL) 1.25 MG (50000 UT) capsule Take one cap q week 06/14/22  Yes McDonough, Hannah Hurst  Menaquinone-7 (VITAMIN K2 PO) Take by mouth.   Yes [provider]  pantoprazole (PROTONIX) 40 MG tablet Take 1 tablet (40 mg total) by mouth daily. 10/18/22  Yes Hannah Lame, MD  Perfluorohexyloctane (MIEBO OP) Apply 3 mLs to eye in the morning, at noon, in the evening, and at bedtime. Use 2 drops per eye 4x daily.   Yes [provider]    Allergies as of 10/18/2022 - Review Complete 10/18/2022  Allergen  Reaction Noted   Other  09/24/2018   Hydroxychloroquine Rash 08/06/2016    Family History  Problem Relation Age of Onset   Varicose Veins Mother    Kidney disease Mother    Heart disease Mother    Diabetes Mother    Depression Mother    COPD Mother    Arthritis Mother    Stroke Father    Heart disease Father    Hypertension Father    Alzheimer's disease Father    Arthritis Sister    Hyperlipidemia Sister    Varicose Veins Sister    Arthritis Brother    Hypertension Brother    Hyperlipidemia Brother    Arthritis Son    Arthritis Maternal Aunt    COPD Maternal Aunt    Hyperlipidemia Maternal Aunt    Heart disease Maternal Aunt    Stroke Maternal Aunt    Lupus Maternal Aunt    Alzheimer's disease Paternal Grandmother    Alcohol abuse Paternal Grandfather     Social History   Socioeconomic History   Marital status: Divorced    Spouse name: Not on file   Number of children: Not on file   Years of education: Not on file   Highest education level: Not on file  Occupational History   Not on file  Tobacco Use   Smoking status: Every Day  Packs/day: 0.50    Years: 20.00    Total pack years: 10.00    Types: Cigarettes   Smokeless tobacco: Never   Tobacco comments:    Smokes approx. 6 cigarettes daily. (10/25/22  Quit)  Vaping Use   Vaping Use: Former  Substance and Sexual Activity   Alcohol use: Yes    Comment: social   Drug use: No   Sexual activity: Not on file  Other Topics Concern   Not on file  Social History Narrative   Not on file   Social Determinants of Health   Financial Resource Strain: Not on file  Food Insecurity: Not on file  Transportation Needs: Not on file  Physical Activity: Not on file  Stress: Not on file  Social Connections: Not on file  Intimate Partner Violence: Not on file    Review of Systems: See HPI, otherwise negative ROS  Physical Exam: BP 116/73   Pulse 67   Temp (!) 97.3 F (36.3 C) (Temporal)   Ht 5' 5.5"  (1.664 m)   Wt 56.7 kg   SpO2 100%   BMI 20.48 kg/m  General:   Alert,  pleasant and cooperative in NAD Head:  Normocephalic and atraumatic. Neck:  Supple; no masses or thyromegaly. Lungs:  Clear throughout to auscultation.    Heart:  Regular rate and rhythm. Abdomen:  Soft, nontender and nondistended. Normal bowel sounds, without guarding, and without rebound.   Neurologic:  Alert and  oriented x4;  grossly normal neurologically.  Impression/Plan: Hannah Hurst is here for an endoscopy and colonoscopy to be performed for GERD and screening  Risks, benefits, limitations, and alternatives regarding  endoscopy and colonoscopy have been reviewed with the patient.  Questions have been answered.  All parties agreeable.   Hannah Lame, MD  10/29/2022, 9:06 AM

## 2022-11-01 ENCOUNTER — Encounter: Payer: Self-pay | Admitting: Gastroenterology

## 2022-11-02 LAB — SURGICAL PATHOLOGY

## 2022-11-03 ENCOUNTER — Encounter: Payer: Self-pay | Admitting: Gastroenterology

## 2022-11-03 DIAGNOSIS — M62830 Muscle spasm of back: Secondary | ICD-10-CM | POA: Diagnosis not present

## 2022-11-03 DIAGNOSIS — M41127 Adolescent idiopathic scoliosis, lumbosacral region: Secondary | ICD-10-CM | POA: Diagnosis not present

## 2022-11-03 DIAGNOSIS — M9903 Segmental and somatic dysfunction of lumbar region: Secondary | ICD-10-CM | POA: Diagnosis not present

## 2022-11-03 DIAGNOSIS — M5416 Radiculopathy, lumbar region: Secondary | ICD-10-CM | POA: Diagnosis not present

## 2022-11-05 DIAGNOSIS — M41127 Adolescent idiopathic scoliosis, lumbosacral region: Secondary | ICD-10-CM | POA: Diagnosis not present

## 2022-11-05 DIAGNOSIS — M5416 Radiculopathy, lumbar region: Secondary | ICD-10-CM | POA: Diagnosis not present

## 2022-11-05 DIAGNOSIS — M9903 Segmental and somatic dysfunction of lumbar region: Secondary | ICD-10-CM | POA: Diagnosis not present

## 2022-11-05 DIAGNOSIS — M62830 Muscle spasm of back: Secondary | ICD-10-CM | POA: Diagnosis not present

## 2022-11-08 DIAGNOSIS — M62830 Muscle spasm of back: Secondary | ICD-10-CM | POA: Diagnosis not present

## 2022-11-08 DIAGNOSIS — M9903 Segmental and somatic dysfunction of lumbar region: Secondary | ICD-10-CM | POA: Diagnosis not present

## 2022-11-08 DIAGNOSIS — M5416 Radiculopathy, lumbar region: Secondary | ICD-10-CM | POA: Diagnosis not present

## 2022-11-08 DIAGNOSIS — M41127 Adolescent idiopathic scoliosis, lumbosacral region: Secondary | ICD-10-CM | POA: Diagnosis not present

## 2022-11-10 DIAGNOSIS — M9903 Segmental and somatic dysfunction of lumbar region: Secondary | ICD-10-CM | POA: Diagnosis not present

## 2022-11-10 DIAGNOSIS — M41127 Adolescent idiopathic scoliosis, lumbosacral region: Secondary | ICD-10-CM | POA: Diagnosis not present

## 2022-11-10 DIAGNOSIS — M62830 Muscle spasm of back: Secondary | ICD-10-CM | POA: Diagnosis not present

## 2022-11-10 DIAGNOSIS — M5416 Radiculopathy, lumbar region: Secondary | ICD-10-CM | POA: Diagnosis not present

## 2022-11-12 DIAGNOSIS — M9903 Segmental and somatic dysfunction of lumbar region: Secondary | ICD-10-CM | POA: Diagnosis not present

## 2022-11-12 DIAGNOSIS — M62830 Muscle spasm of back: Secondary | ICD-10-CM | POA: Diagnosis not present

## 2022-11-12 DIAGNOSIS — M5416 Radiculopathy, lumbar region: Secondary | ICD-10-CM | POA: Diagnosis not present

## 2022-11-12 DIAGNOSIS — M41127 Adolescent idiopathic scoliosis, lumbosacral region: Secondary | ICD-10-CM | POA: Diagnosis not present

## 2022-11-15 DIAGNOSIS — M62830 Muscle spasm of back: Secondary | ICD-10-CM | POA: Diagnosis not present

## 2022-11-15 DIAGNOSIS — M5416 Radiculopathy, lumbar region: Secondary | ICD-10-CM | POA: Diagnosis not present

## 2022-11-15 DIAGNOSIS — M9903 Segmental and somatic dysfunction of lumbar region: Secondary | ICD-10-CM | POA: Diagnosis not present

## 2022-11-15 DIAGNOSIS — M41127 Adolescent idiopathic scoliosis, lumbosacral region: Secondary | ICD-10-CM | POA: Diagnosis not present

## 2022-11-17 DIAGNOSIS — M5416 Radiculopathy, lumbar region: Secondary | ICD-10-CM | POA: Diagnosis not present

## 2022-11-17 DIAGNOSIS — M9903 Segmental and somatic dysfunction of lumbar region: Secondary | ICD-10-CM | POA: Diagnosis not present

## 2022-11-17 DIAGNOSIS — M62830 Muscle spasm of back: Secondary | ICD-10-CM | POA: Diagnosis not present

## 2022-11-17 DIAGNOSIS — M41127 Adolescent idiopathic scoliosis, lumbosacral region: Secondary | ICD-10-CM | POA: Diagnosis not present

## 2022-11-18 DIAGNOSIS — R0981 Nasal congestion: Secondary | ICD-10-CM | POA: Diagnosis not present

## 2022-11-19 DIAGNOSIS — M62830 Muscle spasm of back: Secondary | ICD-10-CM | POA: Diagnosis not present

## 2022-11-19 DIAGNOSIS — M41127 Adolescent idiopathic scoliosis, lumbosacral region: Secondary | ICD-10-CM | POA: Diagnosis not present

## 2022-11-19 DIAGNOSIS — M5416 Radiculopathy, lumbar region: Secondary | ICD-10-CM | POA: Diagnosis not present

## 2022-11-19 DIAGNOSIS — M9903 Segmental and somatic dysfunction of lumbar region: Secondary | ICD-10-CM | POA: Diagnosis not present

## 2022-11-22 DIAGNOSIS — M41127 Adolescent idiopathic scoliosis, lumbosacral region: Secondary | ICD-10-CM | POA: Diagnosis not present

## 2022-11-22 DIAGNOSIS — M9903 Segmental and somatic dysfunction of lumbar region: Secondary | ICD-10-CM | POA: Diagnosis not present

## 2022-11-22 DIAGNOSIS — M5416 Radiculopathy, lumbar region: Secondary | ICD-10-CM | POA: Diagnosis not present

## 2022-11-22 DIAGNOSIS — M62830 Muscle spasm of back: Secondary | ICD-10-CM | POA: Diagnosis not present

## 2022-11-24 DIAGNOSIS — M62830 Muscle spasm of back: Secondary | ICD-10-CM | POA: Diagnosis not present

## 2022-11-24 DIAGNOSIS — M5416 Radiculopathy, lumbar region: Secondary | ICD-10-CM | POA: Diagnosis not present

## 2022-11-24 DIAGNOSIS — M9903 Segmental and somatic dysfunction of lumbar region: Secondary | ICD-10-CM | POA: Diagnosis not present

## 2022-11-24 DIAGNOSIS — M41127 Adolescent idiopathic scoliosis, lumbosacral region: Secondary | ICD-10-CM | POA: Diagnosis not present

## 2022-11-26 DIAGNOSIS — L931 Subacute cutaneous lupus erythematosus: Secondary | ICD-10-CM | POA: Diagnosis not present

## 2022-11-26 DIAGNOSIS — L309 Dermatitis, unspecified: Secondary | ICD-10-CM | POA: Diagnosis not present

## 2022-11-26 DIAGNOSIS — D225 Melanocytic nevi of trunk: Secondary | ICD-10-CM | POA: Diagnosis not present

## 2022-11-26 DIAGNOSIS — D2261 Melanocytic nevi of right upper limb, including shoulder: Secondary | ICD-10-CM | POA: Diagnosis not present

## 2022-11-29 DIAGNOSIS — M9903 Segmental and somatic dysfunction of lumbar region: Secondary | ICD-10-CM | POA: Diagnosis not present

## 2022-11-29 DIAGNOSIS — M62830 Muscle spasm of back: Secondary | ICD-10-CM | POA: Diagnosis not present

## 2022-11-29 DIAGNOSIS — M5416 Radiculopathy, lumbar region: Secondary | ICD-10-CM | POA: Diagnosis not present

## 2022-11-29 DIAGNOSIS — M41127 Adolescent idiopathic scoliosis, lumbosacral region: Secondary | ICD-10-CM | POA: Diagnosis not present

## 2022-12-01 DIAGNOSIS — M5416 Radiculopathy, lumbar region: Secondary | ICD-10-CM | POA: Diagnosis not present

## 2022-12-01 DIAGNOSIS — M62830 Muscle spasm of back: Secondary | ICD-10-CM | POA: Diagnosis not present

## 2022-12-01 DIAGNOSIS — M9903 Segmental and somatic dysfunction of lumbar region: Secondary | ICD-10-CM | POA: Diagnosis not present

## 2022-12-01 DIAGNOSIS — M41127 Adolescent idiopathic scoliosis, lumbosacral region: Secondary | ICD-10-CM | POA: Diagnosis not present

## 2022-12-06 DIAGNOSIS — M41127 Adolescent idiopathic scoliosis, lumbosacral region: Secondary | ICD-10-CM | POA: Diagnosis not present

## 2022-12-06 DIAGNOSIS — M9903 Segmental and somatic dysfunction of lumbar region: Secondary | ICD-10-CM | POA: Diagnosis not present

## 2022-12-06 DIAGNOSIS — M5416 Radiculopathy, lumbar region: Secondary | ICD-10-CM | POA: Diagnosis not present

## 2022-12-06 DIAGNOSIS — M62830 Muscle spasm of back: Secondary | ICD-10-CM | POA: Diagnosis not present

## 2022-12-31 ENCOUNTER — Other Ambulatory Visit: Payer: Self-pay | Admitting: Internal Medicine

## 2022-12-31 DIAGNOSIS — R5382 Chronic fatigue, unspecified: Secondary | ICD-10-CM

## 2023-01-26 DIAGNOSIS — M19041 Primary osteoarthritis, right hand: Secondary | ICD-10-CM | POA: Diagnosis not present

## 2023-01-26 DIAGNOSIS — M064 Inflammatory polyarthropathy: Secondary | ICD-10-CM | POA: Diagnosis not present

## 2023-01-26 DIAGNOSIS — L932 Other local lupus erythematosus: Secondary | ICD-10-CM | POA: Diagnosis not present

## 2023-01-26 DIAGNOSIS — M19042 Primary osteoarthritis, left hand: Secondary | ICD-10-CM | POA: Diagnosis not present

## 2023-01-27 ENCOUNTER — Other Ambulatory Visit: Payer: Self-pay | Admitting: Rheumatology

## 2023-01-27 DIAGNOSIS — M064 Inflammatory polyarthropathy: Secondary | ICD-10-CM

## 2023-02-01 ENCOUNTER — Encounter: Payer: Self-pay | Admitting: Rheumatology

## 2023-02-05 ENCOUNTER — Ambulatory Visit
Admission: RE | Admit: 2023-02-05 | Discharge: 2023-02-05 | Disposition: A | Discharge status: Home / Self Care | Payer: BC Managed Care – PPO | Source: Ambulatory Visit | Attending: Rheumatology | Admitting: Rheumatology

## 2023-02-05 DIAGNOSIS — M7989 Other specified soft tissue disorders: Secondary | ICD-10-CM | POA: Diagnosis not present

## 2023-02-05 DIAGNOSIS — R202 Paresthesia of skin: Secondary | ICD-10-CM | POA: Diagnosis not present

## 2023-02-05 DIAGNOSIS — M064 Inflammatory polyarthropathy: Secondary | ICD-10-CM

## 2023-02-05 DIAGNOSIS — R200 Anesthesia of skin: Secondary | ICD-10-CM | POA: Diagnosis not present

## 2023-02-05 DIAGNOSIS — M25541 Pain in joints of right hand: Secondary | ICD-10-CM | POA: Diagnosis not present

## 2023-02-09 DIAGNOSIS — R7309 Other abnormal glucose: Secondary | ICD-10-CM | POA: Diagnosis not present

## 2023-02-09 DIAGNOSIS — E559 Vitamin D deficiency, unspecified: Secondary | ICD-10-CM | POA: Diagnosis not present

## 2023-02-10 LAB — HGB A1C W/O EAG: Hgb A1c MFr Bld: 4.8 % (ref 4.8–5.6)

## 2023-02-10 LAB — VITAMIN D 25 HYDROXY (VIT D DEFICIENCY, FRACTURES): Vit D, 25-Hydroxy: 191.2 ng/mL — ABNORMAL HIGH (ref 30.0–100.0)

## 2023-02-14 ENCOUNTER — Encounter: Payer: Self-pay | Admitting: Physician Assistant

## 2023-02-14 ENCOUNTER — Ambulatory Visit (INDEPENDENT_AMBULATORY_CARE_PROVIDER_SITE_OTHER): Payer: BC Managed Care – PPO | Admitting: Physician Assistant

## 2023-02-14 VITALS — BP 125/80 | HR 90 | Temp 97.6°F | Resp 16 | Ht 65.5 in | Wt 127.0 lb

## 2023-02-14 DIAGNOSIS — E559 Vitamin D deficiency, unspecified: Secondary | ICD-10-CM

## 2023-02-14 DIAGNOSIS — M545 Low back pain, unspecified: Secondary | ICD-10-CM

## 2023-02-14 DIAGNOSIS — R5382 Chronic fatigue, unspecified: Secondary | ICD-10-CM

## 2023-02-14 DIAGNOSIS — R946 Abnormal results of thyroid function studies: Secondary | ICD-10-CM | POA: Diagnosis not present

## 2023-02-14 DIAGNOSIS — E782 Mixed hyperlipidemia: Secondary | ICD-10-CM | POA: Diagnosis not present

## 2023-02-14 DIAGNOSIS — E538 Deficiency of other specified B group vitamins: Secondary | ICD-10-CM

## 2023-02-14 NOTE — Progress Notes (Signed)
[facility] [address] [place], [place] [address]  Internal MEDICINE  Office Visit Note  Patient Name: Hannah Hurst  [id number]  [id number]  Date of Service: 02/14/2023  Chief Complaint  Patient presents with   Follow-up   Quality Metric Gaps    Shingles Vaccine    HPI Pt is here for routine follow up -States she has been taking the weekly drisdol plus a daily supplement which she took by mistake instead of her calcium supplement and now her Vit D is very high. She has stopped all Vit D for now and may resume OTC only in future after a break. Will recheck this with other annual labs before CPE in the fall -Left side lower back pain, has tried chiropractor and resting it. Makes sleeping difficult. No known injury. Has been bothering her on and off for a few years, but now is daily. Neg SLR on exam and no sciatica or radiating pain. Discussed ordering imaging vs establishing with ortho and she would like a referral to ortho -A little incontinence more than usual, but no other urinary symptoms.  -Rheumatology had prescribed celebrex but isnt taking this because she realized she didn't due well with it before. Does have carpal tunnel and is going to see ortho about this. States she is going to try to get in with Dr. Rosalita Chessman in [place] -May get shingles vaccines and update tdap as well . Current Medication: Outpatient Encounter Medications as of 02/14/2023  Medication Sig   betamethasone dipropionate (DIPROLENE) 0.05 % ointment Apply topically 2 (two) times daily.   DULoxetine (CYMBALTA) 20 MG capsule TAKE ONE CAPSULE BY MOUTH DAILY   Menaquinone-7 (VITAMIN K2 PO) Take by mouth.   pantoprazole (PROTONIX) 40 MG tablet Take 1 tablet (40 mg total) by mouth daily.   Perfluorohexyloctane (MIEBO OP) Apply 3 mLs to eye in the morning, at noon, in the evening, and at bedtime. Use 2 drops per eye 4x daily.   [DISCONTINUED] ergocalciferol (DRISDOL) 1.25 MG (50000 UT) capsule Take one  cap q week   No facility-administered encounter medications on file as of 02/14/2023.    Surgical History: Past Surgical History:  Procedure Laterality Date   ABDOMINAL HYSTERECTOMY     CHOLECYSTECTOMY     COLONOSCOPY WITH PROPOFOL N/A 10/29/2022   Procedure: COLONOSCOPY WITH PROPOFOL;  Surgeon: Midge Minium, MD;  Location: [facility];  Service: Endoscopy;  Laterality: N/A;   ESOPHAGOGASTRODUODENOSCOPY (EGD) WITH PROPOFOL N/A 10/29/2022   Procedure: ESOPHAGOGASTRODUODENOSCOPY (EGD) WITH PROPOFOL;  Surgeon: Midge Minium, MD;  Location: [facility];  Service: Endoscopy;  Laterality: N/A;   ilestomy reversal  2016   NASAL POLYP SURGERY     PANCREAS SURGERY     TOTAL KNEE ARTHROPLASTY Right 08/07/2014   dr Eulah Pont   TOTAL KNEE ARTHROPLASTY Right 08/07/2014   Procedure: RIGHT TOTAL KNEE ARTHROPLASTY;  Surgeon: Loreta Ave, MD;  Location: [facility];  Service: Orthopedics;  Laterality: Right;    Medical History: Past Medical History:  Diagnosis Date   Anxiety    adhd   Arthritis    PRIMARY GENERAL BILATERAL KNEES   Cancer ([facility])    Family history of adverse reaction to anesthesia    MOTHER HAS NAUSEA   History of reversal of ileostomy 2016   Ileostomy in place ([facility]) 2015   Lupus ([facility])    Osteopenia     Family History: Family History  Problem Relation Age of Onset   Varicose Veins Mother  Kidney disease Mother    Heart disease Mother    Diabetes Mother    Depression Mother    COPD Mother    Arthritis Mother    Stroke Father    Heart disease Father    Hypertension Father    Alzheimer's disease Father    Arthritis Sister    Hyperlipidemia Sister    Varicose Veins Sister    Arthritis Brother    Hypertension Brother    Hyperlipidemia Brother    Arthritis Son    Arthritis Maternal Aunt    COPD Maternal Aunt    Hyperlipidemia Maternal Aunt    Heart disease Maternal Aunt    Stroke Maternal Aunt    Lupus Maternal Aunt    Alzheimer's disease Paternal  Grandmother    Alcohol abuse Paternal Grandfather     Social History   Socioeconomic History   Marital status: Divorced    Spouse name: Not on file   Number of children: Not on file   Years of education: Not on file   Highest education level: Not on file  Occupational History   Not on file  Tobacco Use   Smoking status: Every Day    Packs/day: 0.50    Years: 20.00    Additional pack years: 0.00    Total pack years: 10.00    Types: Cigarettes   Smokeless tobacco: Never   Tobacco comments:    Smokes approx. 6 cigarettes daily. (10/25/22  Quit)  Vaping Use   Vaping Use: Former  Substance and Sexual Activity   Alcohol use: Yes    Comment: social   Drug use: No   Sexual activity: Not on file  Other Topics Concern   Not on file  Social History Narrative   Not on file   Social Determinants of Health   Financial Resource Strain: Not on file  Food Insecurity: Not on file  Transportation Needs: Not on file  Physical Activity: Not on file  Stress: Not on file  Social Connections: Not on file  Intimate Partner Violence: Not on file      Review of Systems  Constitutional:  Negative for chills, fatigue and unexpected weight change.  HENT:  Negative for congestion, rhinorrhea, sneezing and sore throat.   Eyes:  Negative for redness.  Respiratory:  Negative for cough, chest tightness, shortness of breath and wheezing.   Cardiovascular:  Negative for chest pain and palpitations.  Gastrointestinal:  Negative for abdominal pain, constipation, diarrhea, nausea and vomiting.  Genitourinary:  Negative for dysuria and frequency.  Musculoskeletal:  Positive for arthralgias and back pain. Negative for joint swelling and neck pain.  Skin:  Negative for rash.  Neurological: Negative.  Negative for tremors and numbness.  Hematological:  Negative for adenopathy. Does not bruise/bleed easily.  Psychiatric/Behavioral:  Positive for sleep disturbance. Negative for behavioral problems  (Depression) and suicidal ideas. The patient is not nervous/anxious.     Vital Signs: BP 125/80   Pulse 90   Temp 97.6 F (36.4 C)   Resp 16   Ht 5' 5.5" (1.664 m)   Wt 127 lb (57.6 kg)   SpO2 98%   BMI 20.81 kg/m    Physical Exam Constitutional:      Appearance: Normal appearance.  HENT:     Head: Normocephalic and atraumatic.     Nose: Nose normal.     Mouth/Throat:     Mouth: Mucous membranes are moist.     Pharynx: No posterior oropharyngeal erythema.  Eyes:  Extraocular Movements: Extraocular movements intact.     Pupils: Pupils are equal, round, and reactive to light.  Cardiovascular:     Rate and Rhythm: Normal rate and regular rhythm.     Pulses: Normal pulses.     Heart sounds: Normal heart sounds.  Pulmonary:     Effort: Pulmonary effort is normal.     Breath sounds: Normal breath sounds.  Musculoskeletal:        General: Tenderness present.     Comments: Left side low back tenderness, no deformity. Neg SLR. No pain with hip ROM. Increased pain with sitting up and laying down  Neurological:     General: No focal deficit present.     Mental Status: She is alert.  Psychiatric:        Mood and Affect: Mood normal.        Behavior: Behavior normal.        Assessment/Plan: 1. Acute left-sided low back pain without sciatica Pt requested ortho referral, may try heat/ice and gentle stretching - Ambulatory referral to Orthopedics  2. Vitamin D deficiency Will hold vit D supplement for 1 month due to doubling up on supplement and causing elevated reading - VITAMIN D 25 Hydroxy (Vit-D Deficiency, Fractures)  3. Mixed hyperlipidemia - Lipid Panel With LDL/HDL Ratio  4. Abnormal thyroid exam - TSH + free T4  5. B12 deficiency - B12 and Folate Panel  6. Chronic fatigue - VITAMIN D 25 Hydroxy (Vit-D Deficiency, Fractures) - B12 and Folate Panel - CBC w/Diff/Platelet - Comprehensive metabolic panel - Lipid Panel With LDL/HDL Ratio -  Fe+TIBC+Fer - TSH + free T4   General Counseling: Margaux verbalizes understanding of the findings of todays visit and agrees with plan of treatment. I have discussed any further diagnostic evaluation that may be needed or ordered today. We also reviewed her medications today. she has been encouraged to call the office with any questions or concerns that should arise related to todays visit.    Orders Placed This Encounter  Procedures   VITAMIN D 25 Hydroxy (Vit-D Deficiency, Fractures)   B12 and Folate Panel   CBC w/Diff/Platelet   Comprehensive metabolic panel   Lipid Panel With LDL/HDL Ratio   Fe+TIBC+Fer   TSH + free T4   Ambulatory referral to Orthopedics    No orders of the defined types were placed in this encounter.   This patient was seen by Lynn Ito, PA-C in collaboration with Dr. Beverely Risen as a part of collaborative care agreement.   Total time spent:30 Minutes Time spent includes review of chart, medications, test results, and follow up plan with the patient.      Dr Lyndon Code Internal medicine

## 2023-02-15 ENCOUNTER — Telehealth: Payer: Self-pay | Admitting: Physician Assistant

## 2023-02-15 NOTE — Telephone Encounter (Signed)
Orthopedic Surgery referral sent via [facility] to [facility]. -Toni 

## 2023-02-16 DIAGNOSIS — G5603 Carpal tunnel syndrome, bilateral upper limbs: Secondary | ICD-10-CM | POA: Diagnosis not present

## 2023-02-16 DIAGNOSIS — M5416 Radiculopathy, lumbar region: Secondary | ICD-10-CM | POA: Diagnosis not present

## 2023-02-16 DIAGNOSIS — M542 Cervicalgia: Secondary | ICD-10-CM | POA: Diagnosis not present

## 2023-02-17 ENCOUNTER — Telehealth: Payer: Self-pay | Admitting: Physician Assistant

## 2023-02-17 NOTE — Telephone Encounter (Signed)
Orthopedic Surgery appointment 02/16/2023 @ [facility]-Toni

## 2023-03-03 DIAGNOSIS — M13841 Other specified arthritis, right hand: Secondary | ICD-10-CM | POA: Diagnosis not present

## 2023-03-03 DIAGNOSIS — G5603 Carpal tunnel syndrome, bilateral upper limbs: Secondary | ICD-10-CM | POA: Diagnosis not present

## 2023-03-03 DIAGNOSIS — M5416 Radiculopathy, lumbar region: Secondary | ICD-10-CM | POA: Insufficient documentation

## 2023-03-03 DIAGNOSIS — M13831 Other specified arthritis, right wrist: Secondary | ICD-10-CM | POA: Diagnosis not present

## 2023-03-03 DIAGNOSIS — M542 Cervicalgia: Secondary | ICD-10-CM | POA: Diagnosis not present

## 2023-03-04 DIAGNOSIS — M180 Bilateral primary osteoarthritis of first carpometacarpal joints: Secondary | ICD-10-CM | POA: Insufficient documentation

## 2023-03-08 DIAGNOSIS — M542 Cervicalgia: Secondary | ICD-10-CM | POA: Diagnosis not present

## 2023-03-08 DIAGNOSIS — M5416 Radiculopathy, lumbar region: Secondary | ICD-10-CM | POA: Diagnosis not present

## 2023-03-08 DIAGNOSIS — M19042 Primary osteoarthritis, left hand: Secondary | ICD-10-CM | POA: Diagnosis not present

## 2023-03-08 DIAGNOSIS — M19041 Primary osteoarthritis, right hand: Secondary | ICD-10-CM | POA: Diagnosis not present

## 2023-03-08 DIAGNOSIS — M539 Dorsopathy, unspecified: Secondary | ICD-10-CM | POA: Diagnosis not present

## 2023-03-08 DIAGNOSIS — L932 Other local lupus erythematosus: Secondary | ICD-10-CM | POA: Diagnosis not present

## 2023-03-09 DIAGNOSIS — R202 Paresthesia of skin: Secondary | ICD-10-CM | POA: Diagnosis not present

## 2023-04-05 DIAGNOSIS — M1812 Unilateral primary osteoarthritis of first carpometacarpal joint, left hand: Secondary | ICD-10-CM | POA: Diagnosis not present

## 2023-04-05 DIAGNOSIS — M13132 Monoarthritis, not elsewhere classified, left wrist: Secondary | ICD-10-CM | POA: Diagnosis not present

## 2023-04-05 DIAGNOSIS — M19032 Primary osteoarthritis, left wrist: Secondary | ICD-10-CM | POA: Diagnosis not present

## 2023-04-05 DIAGNOSIS — G5602 Carpal tunnel syndrome, left upper limb: Secondary | ICD-10-CM | POA: Diagnosis not present

## 2023-04-05 DIAGNOSIS — M67442 Ganglion, left hand: Secondary | ICD-10-CM | POA: Diagnosis not present

## 2023-04-06 ENCOUNTER — Ambulatory Visit: Payer: BC Managed Care – PPO | Admitting: Internal Medicine

## 2023-04-20 ENCOUNTER — Other Ambulatory Visit: Payer: Self-pay | Admitting: Gastroenterology

## 2023-04-20 DIAGNOSIS — M1812 Unilateral primary osteoarthritis of first carpometacarpal joint, left hand: Secondary | ICD-10-CM | POA: Diagnosis not present

## 2023-04-20 DIAGNOSIS — Z4789 Encounter for other orthopedic aftercare: Secondary | ICD-10-CM | POA: Diagnosis not present

## 2023-04-22 ENCOUNTER — Telehealth: Payer: Self-pay | Admitting: Physician Assistant

## 2023-04-22 ENCOUNTER — Other Ambulatory Visit: Payer: Self-pay | Admitting: Internal Medicine

## 2023-04-22 DIAGNOSIS — R5382 Chronic fatigue, unspecified: Secondary | ICD-10-CM

## 2023-04-22 NOTE — Telephone Encounter (Signed)
Lvm & sent mychart msg to move 06/20/23 appointmnent-Toni

## 2023-05-04 DIAGNOSIS — M79642 Pain in left hand: Secondary | ICD-10-CM | POA: Diagnosis not present

## 2023-05-04 DIAGNOSIS — Z4789 Encounter for other orthopedic aftercare: Secondary | ICD-10-CM | POA: Diagnosis not present

## 2023-06-01 DIAGNOSIS — Z4789 Encounter for other orthopedic aftercare: Secondary | ICD-10-CM | POA: Diagnosis not present

## 2023-06-01 DIAGNOSIS — M1812 Unilateral primary osteoarthritis of first carpometacarpal joint, left hand: Secondary | ICD-10-CM | POA: Diagnosis not present

## 2023-06-03 DIAGNOSIS — M25642 Stiffness of left hand, not elsewhere classified: Secondary | ICD-10-CM | POA: Diagnosis not present

## 2023-06-03 DIAGNOSIS — M79642 Pain in left hand: Secondary | ICD-10-CM | POA: Diagnosis not present

## 2023-06-13 DIAGNOSIS — M25642 Stiffness of left hand, not elsewhere classified: Secondary | ICD-10-CM | POA: Diagnosis not present

## 2023-06-17 ENCOUNTER — Ambulatory Visit (INDEPENDENT_AMBULATORY_CARE_PROVIDER_SITE_OTHER): Payer: BC Managed Care – PPO | Admitting: Physician Assistant

## 2023-06-17 ENCOUNTER — Encounter: Payer: Self-pay | Admitting: Physician Assistant

## 2023-06-17 VITALS — BP 105/75 | HR 86 | Temp 98.4°F | Resp 16 | Ht 65.5 in | Wt 130.0 lb

## 2023-06-17 DIAGNOSIS — Z0001 Encounter for general adult medical examination with abnormal findings: Secondary | ICD-10-CM | POA: Diagnosis not present

## 2023-06-17 DIAGNOSIS — R300 Dysuria: Secondary | ICD-10-CM | POA: Diagnosis not present

## 2023-06-17 DIAGNOSIS — Z1231 Encounter for screening mammogram for malignant neoplasm of breast: Secondary | ICD-10-CM | POA: Diagnosis not present

## 2023-06-18 LAB — UA/M W/RFLX CULTURE, ROUTINE
Bilirubin, UA: NEGATIVE
Glucose, UA: NEGATIVE
Ketones, UA: NEGATIVE
Leukocytes,UA: NEGATIVE
Nitrite, UA: NEGATIVE
Protein,UA: NEGATIVE
RBC, UA: NEGATIVE
Specific Gravity, UA: 1.018 (ref 1.005–1.030)
Urobilinogen, Ur: 0.2 mg/dL (ref 0.2–1.0)
pH, UA: 5.5 (ref 5.0–7.5)

## 2023-06-18 LAB — MICROSCOPIC EXAMINATION
Bacteria, UA: NONE SEEN
Casts: NONE SEEN /lpf
RBC, Urine: NONE SEEN /hpf (ref 0–2)
WBC, UA: NONE SEEN /hpf (ref 0–5)

## 2023-06-20 ENCOUNTER — Encounter: Payer: BC Managed Care – PPO | Admitting: Physician Assistant

## 2023-06-28 ENCOUNTER — Encounter: Payer: Self-pay | Admitting: Internal Medicine

## 2023-06-28 ENCOUNTER — Ambulatory Visit: Payer: BC Managed Care – PPO | Admitting: Internal Medicine

## 2023-06-30 DIAGNOSIS — M79642 Pain in left hand: Secondary | ICD-10-CM | POA: Diagnosis not present

## 2023-06-30 DIAGNOSIS — M25642 Stiffness of left hand, not elsewhere classified: Secondary | ICD-10-CM | POA: Diagnosis not present

## 2023-07-13 DIAGNOSIS — M1812 Unilateral primary osteoarthritis of first carpometacarpal joint, left hand: Secondary | ICD-10-CM | POA: Diagnosis not present

## 2023-07-13 DIAGNOSIS — G5603 Carpal tunnel syndrome, bilateral upper limbs: Secondary | ICD-10-CM | POA: Diagnosis not present

## 2023-07-13 DIAGNOSIS — Z4789 Encounter for other orthopedic aftercare: Secondary | ICD-10-CM | POA: Diagnosis not present

## 2023-07-18 ENCOUNTER — Other Ambulatory Visit: Payer: Self-pay | Admitting: Internal Medicine

## 2023-07-18 DIAGNOSIS — R5382 Chronic fatigue, unspecified: Secondary | ICD-10-CM

## 2023-07-29 ENCOUNTER — Other Ambulatory Visit: Payer: Self-pay | Admitting: Medical Genetics

## 2023-07-29 DIAGNOSIS — Z006 Encounter for examination for normal comparison and control in clinical research program: Secondary | ICD-10-CM

## 2023-10-25 DIAGNOSIS — L932 Other local lupus erythematosus: Secondary | ICD-10-CM | POA: Diagnosis not present

## 2023-10-25 DIAGNOSIS — M19041 Primary osteoarthritis, right hand: Secondary | ICD-10-CM | POA: Diagnosis not present

## 2023-10-25 DIAGNOSIS — M539 Dorsopathy, unspecified: Secondary | ICD-10-CM | POA: Diagnosis not present

## 2023-10-25 DIAGNOSIS — M7552 Bursitis of left shoulder: Secondary | ICD-10-CM | POA: Diagnosis not present

## 2023-11-29 DIAGNOSIS — D2262 Melanocytic nevi of left upper limb, including shoulder: Secondary | ICD-10-CM | POA: Diagnosis not present

## 2023-11-29 DIAGNOSIS — D225 Melanocytic nevi of trunk: Secondary | ICD-10-CM | POA: Diagnosis not present

## 2023-11-29 DIAGNOSIS — D2261 Melanocytic nevi of right upper limb, including shoulder: Secondary | ICD-10-CM | POA: Diagnosis not present

## 2023-11-29 DIAGNOSIS — Z85828 Personal history of other malignant neoplasm of skin: Secondary | ICD-10-CM | POA: Diagnosis not present

## 2023-12-15 ENCOUNTER — Ambulatory Visit (INDEPENDENT_AMBULATORY_CARE_PROVIDER_SITE_OTHER): Payer: BC Managed Care – PPO | Admitting: Physician Assistant

## 2023-12-15 ENCOUNTER — Encounter: Payer: Self-pay | Admitting: Physician Assistant

## 2023-12-15 VITALS — BP 118/80 | HR 94 | Temp 98.6°F | Resp 16 | Ht 65.5 in | Wt 136.8 lb

## 2023-12-15 DIAGNOSIS — L930 Discoid lupus erythematosus: Secondary | ICD-10-CM

## 2023-12-15 DIAGNOSIS — E559 Vitamin D deficiency, unspecified: Secondary | ICD-10-CM

## 2023-12-15 DIAGNOSIS — R5382 Chronic fatigue, unspecified: Secondary | ICD-10-CM | POA: Diagnosis not present

## 2023-12-15 DIAGNOSIS — E782 Mixed hyperlipidemia: Secondary | ICD-10-CM | POA: Diagnosis not present

## 2023-12-15 DIAGNOSIS — R946 Abnormal results of thyroid function studies: Secondary | ICD-10-CM

## 2023-12-15 DIAGNOSIS — E538 Deficiency of other specified B group vitamins: Secondary | ICD-10-CM

## 2023-12-15 MED ORDER — DULOXETINE HCL 20 MG PO CPEP: 20.0000 mg | ORAL_CAPSULE | Freq: Every day | ORAL | 1 refills | Status: DC

## 2023-12-15 NOTE — Progress Notes (Signed)
 [facility] [address] [place], [place] [address]  Internal MEDICINE  Office Visit Note  Patient Name: Hannah Hurst  [id number]  [id number]  Date of Service: 12/23/2023  Chief Complaint  Patient presents with   Follow-up   Depression   Medication Refill   Quality Metric Gaps    Shingles and TDAP    HPI Pt is here for routine follow up -Does well with cymbalta, does need refill -does want to check labs and will reorder -following with rheumatology, continues to have joint pain but managing.  -Does see ortho as well   Current Medication: Outpatient Encounter Medications as of 12/15/2023  Medication Sig   betamethasone dipropionate (DIPROLENE) 0.05 % ointment Apply topically 2 (two) times daily.   Menaquinone-7 (VITAMIN K2 PO) Take by mouth.   pantoprazole (PROTONIX) 40 MG tablet TAKE 1 TABLET(40 MG) BY MOUTH DAILY   Perfluorohexyloctane (MIEBO OP) Apply 3 mLs to eye in the morning, at noon, in the evening, and at bedtime. Use 2 drops per eye 4x daily.   [DISCONTINUED] DULoxetine (CYMBALTA) 20 MG capsule TAKE ONE CAPSULE BY MOUTH DAILY   DULoxetine (CYMBALTA) 20 MG capsule Take 1 capsule (20 mg total) by mouth daily.   No facility-administered encounter medications on file as of 12/15/2023.    Surgical History: Past Surgical History:  Procedure Laterality Date   ABDOMINAL HYSTERECTOMY     CHOLECYSTECTOMY     COLONOSCOPY WITH PROPOFOL N/A 10/29/2022   Procedure: COLONOSCOPY WITH PROPOFOL;  Surgeon: Midge Minium, MD;  Location: [facility];  Service: Endoscopy;  Laterality: N/A;   ESOPHAGOGASTRODUODENOSCOPY (EGD) WITH PROPOFOL N/A 10/29/2022   Procedure: ESOPHAGOGASTRODUODENOSCOPY (EGD) WITH PROPOFOL;  Surgeon: Midge Minium, MD;  Location: [facility];  Service: Endoscopy;  Laterality: N/A;   ilestomy reversal  2016   NASAL POLYP SURGERY     PANCREAS SURGERY     TOTAL KNEE ARTHROPLASTY Right 08/07/2014   dr Eulah Pont   TOTAL KNEE ARTHROPLASTY Right  08/07/2014   Procedure: RIGHT TOTAL KNEE ARTHROPLASTY;  Surgeon: Loreta Ave, MD;  Location: [facility] OR;  Service: Orthopedics;  Laterality: Right;    Medical History: Past Medical History:  Diagnosis Date   Anxiety    adhd   Arthritis    PRIMARY GENERAL BILATERAL KNEES   Cancer ([facility])    Depression    Family history of adverse reaction to anesthesia    MOTHER HAS NAUSEA   History of reversal of ileostomy 2016   Ileostomy in place [facility]) 2015   Lupus    Osteopenia     Family History: Family History  Problem Relation Age of Onset   Varicose Veins Mother    Kidney disease Mother    Heart disease Mother    Diabetes Mother    Depression Mother    COPD Mother    Arthritis Mother    Stroke Father    Heart disease Father    Hypertension Father    Alzheimer's disease Father    Arthritis Sister    Hyperlipidemia Sister    Varicose Veins Sister    Arthritis Brother    Hypertension Brother    Hyperlipidemia Brother    Arthritis Son    Arthritis Maternal Aunt    COPD Maternal Aunt    Hyperlipidemia Maternal Aunt    Heart disease Maternal Aunt    Stroke Maternal Aunt    Lupus Maternal Aunt    Alzheimer's disease Paternal Grandmother    Alcohol abuse Paternal Actor  Social History   Socioeconomic History   Marital status: Divorced    Spouse name: Not on file   Number of children: Not on file   Years of education: Not on file   Highest education level: Not on file  Occupational History   Not on file  Tobacco Use   Smoking status: Former    Current packs/day: 0.50    Average packs/day: 0.5 packs/day for 20.0 years (10.0 ttl pk-yrs)    Types: Cigarettes   Smokeless tobacco: Never  Vaping Use   Vaping status: Former  Substance and Sexual Activity   Alcohol use: Yes    Comment: social   Drug use: No   Sexual activity: Not on file  Other Topics Concern   Not on file  Social History Narrative   Not on file   Social Drivers of Health   Financial  Resource Strain: Not on file  Food Insecurity: Not on file  Transportation Needs: Not on file  Physical Activity: Not on file  Stress: Not on file  Social Connections: Not on file  Intimate Partner Violence: Not on file      Review of Systems  Constitutional:  Negative for chills, fatigue and unexpected weight change.  HENT:  Negative for congestion, rhinorrhea, sneezing and sore throat.   Eyes:  Negative for redness.  Respiratory:  Negative for cough, chest tightness, shortness of breath and wheezing.   Cardiovascular:  Negative for chest pain and palpitations.  Gastrointestinal:  Negative for abdominal pain, constipation, diarrhea, nausea and vomiting.  Genitourinary:  Negative for dysuria and frequency.  Musculoskeletal:  Positive for arthralgias and back pain. Negative for joint swelling and neck pain.  Skin:  Negative for rash.  Neurological: Negative.  Negative for tremors and numbness.  Hematological:  Negative for adenopathy. Does not bruise/bleed easily.  Psychiatric/Behavioral:  Negative for behavioral problems (Depression) and suicidal ideas. The patient is not nervous/anxious.     Vital Signs: BP 118/80   Pulse 94   Temp 98.6 F (37 C)   Resp 16   Ht 5' 5.5" (1.664 m)   Wt 136 lb 12.8 oz (62.1 kg)   SpO2 96%   BMI 22.42 kg/m    Physical Exam Vitals and nursing note reviewed.  Constitutional:      Appearance: Normal appearance.  HENT:     Head: Normocephalic and atraumatic.  Eyes:     Extraocular Movements: Extraocular movements intact.     Pupils: Pupils are equal, round, and reactive to light.  Cardiovascular:     Rate and Rhythm: Normal rate and regular rhythm.  Pulmonary:     Effort: Pulmonary effort is normal.     Breath sounds: Normal breath sounds.  Skin:    General: Skin is warm and dry.  Neurological:     General: No focal deficit present.     Mental Status: She is alert.  Psychiatric:        Mood and Affect: Mood normal.         Behavior: Behavior normal.        Assessment/Plan: 1. Chronic discoid lupus erythematosus (Primary) Followed by rheumatology  2. Chronic fatigue May continue cymbalta, will also check labs - DULoxetine (CYMBALTA) 20 MG capsule; Take 1 capsule (20 mg total) by mouth daily.  Dispense: 90 capsule; Refill: 1 - CBC w/Diff/Platelet - Comprehensive metabolic panel - TSH + free T4 - B12 and Folate Panel - Lipid Panel With LDL/HDL Ratio - Fe+TIBC+Fer - VITAMIN D 25 Hydroxy (  Vit-D Deficiency, Fractures)  3. Mixed hyperlipidemia - Lipid Panel With LDL/HDL Ratio  4. Abnormal thyroid exam - TSH + free T4  5. B12 deficiency - B12 and Folate Panel  6. Vitamin D deficiency - VITAMIN D 25 Hydroxy (Vit-D Deficiency, Fractures)   General Counseling: Bruna verbalizes understanding of the findings of todays visit and agrees with plan of treatment. I have discussed any further diagnostic evaluation that may be needed or ordered today. We also reviewed her medications today. she has been encouraged to call the office with any questions or concerns that should arise related to todays visit.    Orders Placed This Encounter  Procedures   CBC w/Diff/Platelet   Comprehensive metabolic panel   TSH + free T4   B12 and Folate Panel   Lipid Panel With LDL/HDL Ratio   Fe+TIBC+Fer   VITAMIN D 25 Hydroxy (Vit-D Deficiency, Fractures)    Meds ordered this encounter  Medications   DULoxetine (CYMBALTA) 20 MG capsule    Sig: Take 1 capsule (20 mg total) by mouth daily.    Dispense:  90 capsule    Refill:  1    **Patient requests 90 days supply**    This patient was seen by Lynn Ito, PA-C in collaboration with Dr. Beverely Risen as a part of collaborative care agreement.   Total time spent:30 Minutes Time spent includes review of chart, medications, test results, and follow up plan with the patient.      Dr Lyndon Code Internal medicine

## 2024-01-07 ENCOUNTER — Encounter (INDEPENDENT_AMBULATORY_CARE_PROVIDER_SITE_OTHER): Payer: Self-pay

## 2024-01-13 ENCOUNTER — Other Ambulatory Visit: Payer: Self-pay

## 2024-05-01 DIAGNOSIS — L932 Other local lupus erythematosus: Secondary | ICD-10-CM | POA: Diagnosis not present

## 2024-05-01 DIAGNOSIS — M19042 Primary osteoarthritis, left hand: Secondary | ICD-10-CM | POA: Diagnosis not present

## 2024-05-01 DIAGNOSIS — M539 Dorsopathy, unspecified: Secondary | ICD-10-CM | POA: Diagnosis not present

## 2024-05-01 DIAGNOSIS — M19041 Primary osteoarthritis, right hand: Secondary | ICD-10-CM | POA: Diagnosis not present

## 2024-05-17 DIAGNOSIS — Z8719 Personal history of other diseases of the digestive system: Secondary | ICD-10-CM | POA: Insufficient documentation

## 2024-05-17 NOTE — Patient Instructions (Signed)

## 2024-05-24 ENCOUNTER — Ambulatory Visit: Payer: Self-pay | Admitting: Nurse Practitioner

## 2024-05-24 ENCOUNTER — Encounter: Payer: Self-pay | Admitting: Nurse Practitioner

## 2024-05-24 VITALS — BP 106/64 | HR 76 | Temp 97.8°F | Ht 65.8 in | Wt 120.8 lb

## 2024-05-24 DIAGNOSIS — Z23 Encounter for immunization: Secondary | ICD-10-CM

## 2024-05-24 DIAGNOSIS — R5382 Chronic fatigue, unspecified: Secondary | ICD-10-CM

## 2024-05-24 DIAGNOSIS — E7800 Pure hypercholesterolemia, unspecified: Secondary | ICD-10-CM

## 2024-05-24 DIAGNOSIS — R7301 Impaired fasting glucose: Secondary | ICD-10-CM

## 2024-05-24 DIAGNOSIS — M8580 Other specified disorders of bone density and structure, unspecified site: Secondary | ICD-10-CM | POA: Diagnosis not present

## 2024-05-24 DIAGNOSIS — F325 Major depressive disorder, single episode, in full remission: Secondary | ICD-10-CM | POA: Diagnosis not present

## 2024-05-24 DIAGNOSIS — Z1159 Encounter for screening for other viral diseases: Secondary | ICD-10-CM

## 2024-05-24 DIAGNOSIS — M059 Rheumatoid arthritis with rheumatoid factor, unspecified: Secondary | ICD-10-CM | POA: Diagnosis not present

## 2024-05-24 DIAGNOSIS — Z114 Encounter for screening for human immunodeficiency virus [HIV]: Secondary | ICD-10-CM

## 2024-05-24 DIAGNOSIS — L932 Other local lupus erythematosus: Secondary | ICD-10-CM | POA: Diagnosis not present

## 2024-05-24 MED ORDER — DULOXETINE HCL 30 MG PO CPEP: 30.0000 mg | ORAL_CAPSULE | Freq: Every day | ORAL | 1 refills | Status: AC

## 2024-05-24 NOTE — Progress Notes (Signed)
 New Patient Office Visit  Subjective    Patient ID: Hannah Hurst, female    DOB: 14-Jul-1961  Age: 63 y.o. MRN: [id number]  CC:  Chief Complaint  Patient presents with   Anxiety   Depression   Fatigue    Patient states she would like her vitamin levels checked, history of vitamin D  def    HPI Hannah Hurst presents for new patient visit to establish care.  Introduced to Publishing rights manager role and practice setting.  All questions answered.  Discussed provider/patient relationship and expectations. Was getting primary care at [facility].  CHRONIC FATIGUE & RHEUMATOID ARTHRITIS/LUPUS Follows with rheumatology 05/01/24.  No current medications for RA or Lupus, did take shots year ago for this.  Has joint pain daily, but is tolerable. History of Vitamin D  deficiency, went really low and then took a supplement -- got too high.  Has family history of thyroid  issues. Osteopenia noted on past imaging.  Follows with dermatology as well for lupus -- triamcinolone  & clobetasol cream used, other creams have caused side effects. Duration:  months Severity: 7/10  Onset: sudden Context when symptoms started:  unknown Symptoms improve with rest: no  Depressive symptoms: yes Stress/anxiety: yes Insomnia: yes hard to fall asleep Snoring: no -- does wear mouth guard for grinding teeth Observed apnea by bed partner: no Daytime hypersomnolence:no Wakes feeling refreshed: no History of sleep study: no Dysnea on exertion:  no Orthopnea/PND: no Chest pain: no -- gets dizzy often if bending down or squatting Chronic cough: no Lower extremity edema: no Arthralgias:yes Myalgias: no Weakness: no Rash: keeps a little lupus rash  ANXIETY/STRESS/DEPRESSION Taking Duloxetine  20 MG daily. Has been on this for many years. Prefers to take less medications than more.    In past tried Buspar and Celexa with poor response.  Wellbutrin in past, did not see a significant change.    Duration:uncontrolled Anxious mood: yes  Excessive worrying: yes Irritability: yes  Sweating: no Nausea: no Palpitations:no Hyperventilation: no Panic attacks: no Agoraphobia: no  Obscessions/compulsions: no Depressed mood: yes    2024-06-13    9:26 AM 02/14/2023    1:24 PM 09/07/2022    8:59 AM 06/14/2022    9:01 AM 05/07/2022   11:19 AM  Depression screen PHQ 2/9  Decreased Interest 1 0 0 0 0  Down, Depressed, Hopeless 1 0 0 0 0  PHQ - 2 Score 2 0 0 0 0  Altered sleeping 1      Tired, decreased energy 3      Change in appetite 1      Feeling bad or failure about yourself  2      Trouble concentrating 2      Moving slowly or fidgety/restless 1      Suicidal thoughts 0      PHQ-9 Score 12      Difficult doing work/chores Somewhat difficult      Anhedonia: no Weight changes: no Insomnia: yes hard to fall asleep  Hypersomnia: no Fatigue/loss of energy: yes Feelings of worthlessness: no Feelings of guilt: a little bit Impaired concentration/indecisiveness: yes Suicidal ideations: no  Crying spells: no Recent Stressors/Life Changes: yes   Relationship problems: no   Family stress: yes     Financial stress: no    Job stress: no    Recent death/loss: no     2024-06-13    9:26 AM  GAD 7 : Generalized Anxiety Score  Nervous, Anxious, on Edge 2  Control/stop worrying  2  Worry too much - different things 2  Trouble relaxing 1  Restless 1  Easily annoyed or irritable 1  Afraid - awful might happen 3  Total GAD 7 Score 12  Anxiety Difficulty Somewhat difficult    Outpatient Encounter Medications as of 05/24/2024  Medication Sig   Ascorbic Acid (VITAMIN C) 1000 MG tablet Take 1,000 mg by mouth daily.   clobetasol (TEMOVATE) 0.05 % external solution Apply 1 Application topically 2 (two) times daily.   DULoxetine  (CYMBALTA ) 30 MG capsule Take 1 capsule (30 mg total) by mouth daily.   Pyridoxine HCl (VITAMIN B-6 PO) Take 200 mg by mouth daily at 2 PM.   triamcinolone   ointment (KENALOG) 0.1 % Apply 1 Application topically 2 (two) times daily.   Vitamin D -Vitamin K (VITAMIN K2-VITAMIN D3 PO) Take 1 tablet by mouth daily at 2 PM.   [DISCONTINUED] DULoxetine  (CYMBALTA ) 20 MG capsule Take 1 capsule (20 mg total) by mouth daily.   [DISCONTINUED] betamethasone  dipropionate (DIPROLENE ) 0.05 % ointment Apply topically 2 (two) times daily.   [DISCONTINUED] Halcinonide 0.1 % CREA Apply 1 Application topically 2 (two) times daily.   [DISCONTINUED] Menaquinone-7 (VITAMIN K2 PO) Take by mouth.   [DISCONTINUED] methocarbamol  (ROBAXIN ) 500 MG tablet Take 500 mg by mouth every 6 (six) hours as needed for muscle spasms.   [DISCONTINUED] omeprazole (PRILOSEC) 20 MG capsule Take 20 mg by mouth daily.   [DISCONTINUED] pantoprazole  (PROTONIX ) 40 MG tablet TAKE 1 TABLET(40 MG) BY MOUTH DAILY   [DISCONTINUED] Perfluorohexyloctane (MIEBO OP) Apply 3 mLs to eye in the morning, at noon, in the evening, and at bedtime. Use 2 drops per eye 4x daily.   No facility-administered encounter medications on file as of 05/24/2024.    Past Medical History:  Diagnosis Date   Anxiety    adhd   Arthritis    PRIMARY GENERAL BILATERAL KNEES   Cancer (HCC)    Depression    Family history of adverse reaction to anesthesia    MOTHER HAS NAUSEA   History of reversal of ileostomy 2016   Ileostomy in place [facility]) 2015   Lupus    Osteopenia    Vitamin D  deficiency     Past Surgical History:  Procedure Laterality Date   CHOLECYSTECTOMY     COLONOSCOPY WITH PROPOFOL  N/A 10/29/2022   Procedure: COLONOSCOPY WITH PROPOFOL ;  Surgeon: Jinny Carmine, MD;  Location: [facility];  Service: Endoscopy;  Laterality: N/A;   ESOPHAGOGASTRODUODENOSCOPY (EGD) WITH PROPOFOL  N/A 10/29/2022   Procedure: ESOPHAGOGASTRODUODENOSCOPY (EGD) WITH PROPOFOL ;  Surgeon: Jinny Carmine, MD;  Location: [facility];  Service: Endoscopy;  Laterality: N/A;   HAND SURGERY Left    ilestomy reversal  2016   NASAL  POLYP SURGERY     PANCREAS SURGERY     TOTAL ABDOMINAL HYSTERECTOMY W/ BILATERAL SALPINGOOPHORECTOMY     TOTAL KNEE ARTHROPLASTY Right 08/07/2014   dr beverley   TOTAL KNEE ARTHROPLASTY Right 08/07/2014   Procedure: RIGHT TOTAL KNEE ARTHROPLASTY;  Surgeon: Toribio JULIANNA beverley, MD;  Location: [facility] OR;  Service: Orthopedics;  Laterality: Right;    Family History  Problem Relation Age of Onset   Stroke Mother    Varicose Veins Mother    Kidney disease Mother    Heart disease Mother    Diabetes Mother    Depression Mother    COPD Mother    Arthritis Mother    Hyperlipidemia Father    Stroke Father    Heart disease Father  Hypertension Father    Alzheimer's disease Father    Arthritis Sister    Hyperlipidemia Sister    Varicose Veins Sister    Bradycardia Sister    Arthritis Brother    Hypertension Brother    Hyperlipidemia Brother    Arthritis Son    Hypertension Son    Hyperlipidemia Son    Heart disease Son    Migraines Son    Arthritis Maternal Aunt    COPD Maternal Aunt    Hyperlipidemia Maternal Aunt    Heart disease Maternal Aunt    Stroke Maternal Aunt    Lupus Maternal Aunt    Alzheimer's disease Paternal Grandmother    Alcohol abuse Paternal Grandfather    Cirrhosis Paternal Grandfather     Social History   Socioeconomic History   Marital status: Divorced    Spouse name: Not on file   Number of children: Not on file   Years of education: Not on file   Highest education level: Not on file  Occupational History   Not on file  Tobacco Use   Smoking status: Former    Current packs/day: 0.50    Average packs/day: 0.5 packs/day for 20.0 years (10.0 ttl pk-yrs)    Types: Cigarettes   Smokeless tobacco: Never  Vaping Use   Vaping status: Former  Substance and Sexual Activity   Alcohol use: Yes    Comment: social   Drug use: No   Sexual activity: Not Currently  Other Topics Concern   Not on file  Social History Narrative   Not on file   Social Drivers  of Health   Financial Resource Strain: Low Risk  (05/24/2024)   Overall Financial Resource Strain (CARDIA)    Difficulty of Paying Living Expenses: Not hard at all  Food Insecurity: No Food Insecurity (05/24/2024)   Hunger Vital Sign    Worried About Running Out of Food in the Last Year: Never true    Ran Out of Food in the Last Year: Never true  Transportation Needs: No Transportation Needs (05/24/2024)   PRAPARE - Administrator, Civil Service (Medical): No    Lack of Transportation (Non-Medical): No  Physical Activity: Sufficiently Active (05/24/2024)   Exercise Vital Sign    Days of Exercise per Week: 5 days    Minutes of Exercise per Session: 30 min  Stress: Stress Concern Present (05/24/2024)   [employer] of Occupational Health - Occupational Stress Questionnaire    Feeling of Stress: To some extent  Social Connections: Moderately Isolated (05/24/2024)   Social Connection and Isolation Panel    Frequency of Communication with Friends and Family: Twice a week    Frequency of Social Gatherings with Friends and Family: Twice a week    Attends Religious Services: More than 4 times per year    Active Member of [employer] or Organizations: No    Attends Banker Meetings: Never    Marital Status: Divorced  Catering manager Violence: Not At Risk (05/24/2024)   Humiliation, Afraid, Rape, and Kick questionnaire    Fear of Current or Ex-Partner: No    Emotionally Abused: No    Physically Abused: No    Sexually Abused: No    Review of Systems  Constitutional:  Positive for malaise/fatigue. Negative for chills, diaphoresis, fever and weight loss.  Respiratory:  Negative for cough, shortness of breath and wheezing.   Cardiovascular:  Negative for chest pain, palpitations, orthopnea and leg swelling.  Gastrointestinal: Negative.  Musculoskeletal:  Positive for joint pain.  Neurological: Negative.   Psychiatric/Behavioral:  Positive for depression. Negative for  memory loss and suicidal ideas. The patient is nervous/anxious and has insomnia.        Objective    BP 106/64   Pulse 76   Temp 97.8 F (36.6 C) (Oral)   Ht 5' 5.8 (1.671 m)   Wt 120 lb 12.8 oz (54.8 kg)   SpO2 98%   BMI 19.62 kg/m   Physical Exam Vitals and nursing note reviewed.  Constitutional:      General: She is awake. She is not in acute distress.    Appearance: She is well-developed and well-groomed. She is not ill-appearing or toxic-appearing.  HENT:     Head: Normocephalic.     Right Ear: Hearing and external ear normal.     Left Ear: Hearing and external ear normal.  Eyes:     General: Lids are normal.        Right eye: No discharge.        Left eye: No discharge.     Conjunctiva/sclera: Conjunctivae normal.     Pupils: Pupils are equal, round, and reactive to light.  Neck:     Thyroid : No thyromegaly.     Vascular: No carotid bruit.  Cardiovascular:     Rate and Rhythm: Normal rate and regular rhythm.     Heart sounds: Normal heart sounds. No murmur heard.    No gallop.  Pulmonary:     Effort: Pulmonary effort is normal. No accessory muscle usage or respiratory distress.     Breath sounds: Normal breath sounds.  Abdominal:     General: Bowel sounds are normal. There is no distension.     Palpations: Abdomen is soft.     Tenderness: There is no abdominal tenderness.  Musculoskeletal:     Cervical back: Normal range of motion and neck supple.     Right lower leg: No edema.     Left lower leg: No edema.  Lymphadenopathy:     Cervical: No cervical adenopathy.  Skin:    General: Skin is warm and dry.  Neurological:     Mental Status: She is alert and oriented to person, place, and time.     Deep Tendon Reflexes: Reflexes are normal and symmetric.     Reflex Scores:      Brachioradialis reflexes are 2+ on the right side and 2+ on the left side.      Patellar reflexes are 2+ on the right side and 2+ on the left side. Psychiatric:        Attention  and Perception: Attention normal.        Mood and Affect: Mood normal.        Speech: Speech normal.        Behavior: Behavior normal. Behavior is cooperative.        Thought Content: Thought content normal.    Last CBC Lab Results  Component Value Date   WBC 6.3 09/02/2022   HGB 14.9 09/02/2022   HCT 44.3 09/02/2022   MCV 90.6 09/02/2022   MCH 30.5 09/02/2022   RDW 12.9 09/02/2022   PLT 343 09/02/2022   Last metabolic panel Lab Results  Component Value Date   GLUCOSE 102 (H) 09/02/2022   NA 140 09/02/2022   K 4.6 09/02/2022   CL 103 09/02/2022   CO2 27 09/02/2022   BUN 19 09/02/2022   CREATININE 0.68 09/02/2022   EGFR 100 09/02/2022  CALCIUM 10.1 09/02/2022   PROT 7.1 09/02/2022   ALBUMIN 5.1 (H) 06/10/2022   LABGLOB 1.8 06/10/2022   AGRATIO 2.8 (H) 06/10/2022   BILITOT 0.8 09/02/2022   ALKPHOS 88 06/10/2022   AST 23 09/02/2022   ALT 13 09/02/2022   ANIONGAP 9 03/13/2022   Last lipids Lab Results  Component Value Date   CHOL 236 (H) 06/10/2022   HDL 108 06/10/2022   LDLCALC 117 (H) 06/10/2022   TRIG 66 06/10/2022   Last hemoglobin A1c Lab Results  Component Value Date   HGBA1C 4.8 02/09/2023   Last thyroid  functions Lab Results  Component Value Date   TSH 0.781 06/10/2022   Last vitamin D  Lab Results  Component Value Date   VD25OH 191.2 (H) 02/09/2023        Assessment & Plan:   Problem List Items Addressed This Visit       Musculoskeletal and Integument   Seropositive rheumatoid arthritis (HCC) - Primary   Chronic, ongoing.  No current medications on board.  Continue collaboration with rheumatology.  Recent notes reviewed.  Labs today.      Osteopenia determined by x-ray   Noted on past imaging. Recommend to continue Vitamin D  supplement and adequate calcium intake daily + focus on low weight exercises to strengthen muscles.  Labs today.      Relevant Orders   VITAMIN D  25 Hydroxy (Vit-D Deficiency, Fractures)   Cutaneous lupus  erythematosus   Chronic, ongoing.  No current medications on board.  Continue collaboration with rheumatology.  Recent notes reviewed.  Labs today.      Relevant Orders   CBC with Differential/Platelet   Comprehensive metabolic panel with GFR     Other   Hypercholesterolemia   On past labs noted, continue to focus on diet and exercise.  Check lipid panel today. The ASCVD Risk score (Arnett DK, et al., 2019) failed to calculate for the following reasons:   The valid HDL cholesterol range is 20 to 100 mg/dL       Relevant Orders   Lipid Panel w/o Chol/HDL Ratio   Depression, major, in remission (HCC)   Chronic, ongoing. Denies SI/HI.  Will maintain Duloxetine  which can offer benefit to mood and arthritic pain, but increase to 30 MG daily to see if further benefit to fatigue and mood.  Educated her on this.  Further adjust as needed.        Relevant Medications   DULoxetine  (CYMBALTA ) 30 MG capsule   Chronic fatigue   Ongoing, suspect multiple factor for this including autoimmune disease and depression.  Continue current medication regimen.  Check labs today to include: CBC, CMP, Thyroid  labs (TSH, Free T4, and thyroid  antibodies), iron, ferritin, B12, Vitamin D .  Determine next steps after all labs have returned.      Relevant Orders   TSH   VITAMIN D  25 Hydroxy (Vit-D Deficiency, Fractures)   Vitamin B12   T4, free   Thyroid  peroxidase antibody   Other Visit Diagnoses       IFG (impaired fasting glucose)       A1c check on labs today.   Relevant Orders   HgB A1c     Pneumococcal vaccination given       PCV20 provided today, educated on this.   Relevant Orders   Pneumococcal conjugate vaccine 20-valent (Prevnar 20) (Completed)     Encounter for screening for HIV       HIV screening on labs today, educated patient on this.  Relevant Orders   HIV Antibody (routine testing w rflx)     Need for hepatitis C screening test       Hep C screening on labs today, educated  patient on this.   Relevant Orders   Hepatitis C antibody       Return in about 6 weeks (around 07/05/2024) for ANXIETY, Depression -- increased Duloxetine  + check right ear.   Hannah Ditullio T Weylyn Ricciuti, NP

## 2024-05-24 NOTE — Assessment & Plan Note (Signed)
 Chronic, ongoing.  No current medications on board.  Continue collaboration with rheumatology.  Recent notes reviewed.  Labs today.

## 2024-05-24 NOTE — Assessment & Plan Note (Signed)
 Ongoing, suspect multiple factor for this including autoimmune disease and depression.  Continue current medication regimen.  Check labs today to include: CBC, CMP, Thyroid  labs (TSH, Free T4, and thyroid  antibodies), iron, ferritin, B12, Vitamin D .  Determine next steps after all labs have returned.

## 2024-05-24 NOTE — Assessment & Plan Note (Signed)
 Noted on past imaging. Recommend to continue Vitamin D  supplement and adequate calcium intake daily + focus on low weight exercises to strengthen muscles.  Labs today.

## 2024-05-24 NOTE — Assessment & Plan Note (Signed)
 On past labs noted, continue to focus on diet and exercise.  Check lipid panel today. The ASCVD Risk score (Arnett DK, et al., 2019) failed to calculate for the following reasons:   The valid HDL cholesterol range is 20 to 100 mg/dL

## 2024-05-24 NOTE — Assessment & Plan Note (Signed)
 Chronic, ongoing. Denies SI/HI.  Will maintain Duloxetine  which can offer benefit to mood and arthritic pain, but increase to 30 MG daily to see if further benefit to fatigue and mood.  Educated her on this.  Further adjust as needed.

## 2024-05-25 ENCOUNTER — Ambulatory Visit: Payer: Self-pay | Admitting: Nurse Practitioner

## 2024-05-25 LAB — LIPID PANEL W/O CHOL/HDL RATIO
Cholesterol, Total: 239 mg/dL — ABNORMAL HIGH (ref 100–199)
HDL: 114 mg/dL (ref >39)
LDL Chol Calc (NIH): 118 mg/dL — ABNORMAL HIGH (ref 0–99)
Triglycerides: 45 mg/dL (ref 0–149)
VLDL Cholesterol Cal: 7 mg/dL (ref 5–40)

## 2024-05-25 LAB — CBC WITH DIFFERENTIAL/PLATELET
Basophils Absolute: 0 x10E3/uL (ref 0.0–0.2)
Basos: 1 %
EOS (ABSOLUTE): 0 x10E3/uL (ref 0.0–0.4)
Eos: 1 %
Hematocrit: 43.9 % (ref 34.0–46.6)
Hemoglobin: 14.4 g/dL (ref 11.1–15.9)
Immature Grans (Abs): 0 x10E3/uL (ref 0.0–0.1)
Immature Granulocytes: 0 %
Lymphocytes Absolute: 0.7 x10E3/uL (ref 0.7–3.1)
Lymphs: 15 %
MCH: 31.5 pg (ref 26.6–33.0)
MCHC: 32.8 g/dL (ref 31.5–35.7)
MCV: 96 fL (ref 79–97)
Monocytes Absolute: 0.7 x10E3/uL (ref 0.1–0.9)
Monocytes: 14 %
Neutrophils Absolute: 3.3 x10E3/uL (ref 1.4–7.0)
Neutrophils: 69 %
Platelets: 264 x10E3/uL (ref 150–450)
RBC: 4.57 x10E6/uL (ref 3.77–5.28)
RDW: 12.9 % (ref 11.7–15.4)
WBC: 4.8 x10E3/uL (ref 3.4–10.8)

## 2024-05-25 LAB — VITAMIN D 25 HYDROXY (VIT D DEFICIENCY, FRACTURES): Vit D, 25-Hydroxy: 121 ng/mL — ABNORMAL HIGH (ref 30.0–100.0)

## 2024-05-25 LAB — COMPREHENSIVE METABOLIC PANEL WITH GFR
ALT: 16 IU/L (ref 0–32)
AST: 22 IU/L (ref 0–40)
Albumin: 4.8 g/dL (ref 3.9–4.9)
Alkaline Phosphatase: 88 IU/L (ref 44–121)
BUN/Creatinine Ratio: 28 (ref 12–28)
BUN: 18 mg/dL (ref 8–27)
Bilirubin Total: 0.4 mg/dL (ref 0.0–1.2)
CO2: 23 mmol/L (ref 20–29)
Calcium: 9.7 mg/dL (ref 8.7–10.3)
Chloride: 99 mmol/L (ref 96–106)
Creatinine, Ser: 0.64 mg/dL (ref 0.57–1.00)
Globulin, Total: 1.9 g/dL (ref 1.5–4.5)
Glucose: 94 mg/dL (ref 70–99)
Potassium: 4.1 mmol/L (ref 3.5–5.2)
Sodium: 138 mmol/L (ref 134–144)
Total Protein: 6.7 g/dL (ref 6.0–8.5)
eGFR: 100 mL/min/1.73 (ref >59)

## 2024-05-25 LAB — THYROID PEROXIDASE ANTIBODY: Thyroperoxidase Ab SerPl-aCnc: <9 [IU]/mL (ref 0–34)

## 2024-05-25 LAB — TSH: TSH: 1.19 u[IU]/mL (ref 0.450–4.500)

## 2024-05-25 LAB — VITAMIN B12: Vitamin B-12: 573 pg/mL (ref 232–1245)

## 2024-05-25 LAB — HEPATITIS C ANTIBODY: Hep C Virus Ab: NONREACTIVE

## 2024-05-25 LAB — HEMOGLOBIN A1C
Est. average glucose Bld gHb Est-mCnc: 94 mg/dL
Hgb A1c MFr Bld: 4.9 % (ref 4.8–5.6)

## 2024-05-25 LAB — T4, FREE: Free T4: 1.06 ng/dL (ref 0.82–1.77)

## 2024-05-25 LAB — HIV ANTIBODY (ROUTINE TESTING W REFLEX): HIV Screen 4th Generation wRfx: NONREACTIVE

## 2024-05-25 NOTE — Progress Notes (Signed)
 Contacted via MyChart The ASCVD Risk score (Arnett DK, et al., 2019) failed to calculate for the following reasons:   The valid HDL cholesterol range is 20 to 100 mg/dL  Good afternoon Hannah Hurst, so good to see you yesterday.  Your labs have returned and overall look good with a few exceptions: - Vitamin D  level is still a bit high, if taking your daily supplement try to cut back to taking it 3 times a week and we will recheck at a future visit. I has come down from past level, but still high. - B12 level is middle ground, if you want to try taking a B12 supplement a few days a week that may help with fatigue some and overall nervous system health. - Lipid panel is showing some elevations in LDL and total cholesterol, but your HDL, good cholesterol, is in nice range.  I recommend continue focus on healthy diet and maybe add an Omega 3 supplement daily.   - Remainder of labs normal.  Any questions? Keep being amazing!!  Thank you for allowing me to participate in your care.  I appreciate you. Kindest regards, Dilyn Smiles

## 2024-06-18 ENCOUNTER — Encounter: Payer: BC Managed Care – PPO | Admitting: Physician Assistant

## 2024-06-21 ENCOUNTER — Telehealth: Payer: Self-pay | Admitting: Nurse Practitioner

## 2024-06-21 NOTE — Telephone Encounter (Signed)
 Patient dropped off Physician Verification Form to be filled out by provider. Patient is requesting a call back at [phone] within 2-5 days when completed. Document is located in providers folder.

## 2024-06-22 NOTE — Telephone Encounter (Signed)
 Pending for provider when she returns to office next week.

## 2024-06-27 NOTE — Telephone Encounter (Signed)
 Completed.

## 2024-06-27 NOTE — Telephone Encounter (Signed)
Form on provider's desk for review. 

## 2024-06-30 NOTE — Patient Instructions (Incomplete)
 Be Involved in Caring For Your Health:  Taking Medications When medications are taken as directed, they can greatly improve your health. But if they are not taken as prescribed, they may not work. In some cases, not taking them correctly can be harmful. To help ensure your treatment remains effective and safe, understand your medications and how to take them. Bring your medications to each visit for review by your provider.  Your lab results, notes, and after visit summary will be available on My Chart. We strongly encourage you to use this feature. If lab results are abnormal the clinic will contact you with the appropriate steps. If the clinic does not contact you assume the results are satisfactory. You can always view your results on My Chart. If you have questions regarding your health or results, please contact the clinic during office hours. You can also ask questions on My Chart.  We at [facility] are grateful that you chose [place] to provide your care. We strive to provide evidence-based and compassionate care and are always looking for feedback. If you get a survey from the clinic please complete this so we can hear your opinions.  Managing Anxiety, Adult After being diagnosed with anxiety, you may be relieved to know why you have felt or behaved a certain way. You may also feel overwhelmed about the treatment ahead and what it will mean for your life. With care and support, you can manage your anxiety. How to manage lifestyle changes Understanding the difference between stress and anxiety Although stress can play a role in anxiety, it is not the same as anxiety. Stress is your body's reaction to life changes and events, both good and bad. Stress is often caused by something external, such as a deadline, test, or competition. It normally goes away after the event has ended and will last just a few hours. But, stress can be ongoing and can lead to more than just stress. Anxiety is  caused by something internal, such as imagining a terrible outcome or worrying that something will go wrong that will greatly upset you. Anxiety often does not go away even after the event is over, and it can become a long-term (chronic) worry. Lowering stress and anxiety Talk with your health care provider or a counselor to learn more about lowering anxiety and stress. They may suggest tension-reduction techniques, such as: Music. Spend time creating or listening to music that you enjoy and that inspires you. Mindfulness-based meditation. Practice being aware of your normal breaths while not trying to control your breathing. It can be done while sitting or walking. Centering prayer. Focus on a word, phrase, or sacred image that means something to you and brings you peace. Deep breathing. Expand your stomach and inhale slowly through your nose. Hold your breath for 3-5 seconds. Then breathe out slowly, letting your stomach muscles relax. Self-talk. Learn to notice and spot thought patterns that lead to anxiety reactions. Change those patterns to thoughts that feel peaceful. Muscle relaxation. Take time to tense muscles and then relax them. Choose a tension-reduction technique that fits your lifestyle and personality. These techniques take time and practice. Set aside 5-15 minutes a day to do them. Specialized therapists can offer counseling and training in these techniques. The training to help with anxiety may be covered by some insurance plans. Other things you can do to manage stress and anxiety include: Keeping a stress diary. This can help you learn what triggers your reaction and then learn ways  to manage your response. Thinking about how you react to certain situations. You may not be able to control everything, but you can control your response. Making time for activities that help you relax and not feeling guilty about spending your time in this way. Doing visual imagery. This involves  imagining or creating mental pictures to help you relax. Practicing yoga. Through yoga poses, you can lower tension and relax.  Medicines Medicines for anxiety include: Antidepressant medicines. These are usually prescribed for long-term daily control. Anti-anxiety medicines. These may be added in severe cases, especially when panic attacks occur. When used together, medicines, psychotherapy, and tension-reduction techniques may be the most effective treatment. Relationships Relationships can play a big part in helping you recover. Spend more time connecting with trusted friends and family members. Think about going to couples counseling if you have a partner, taking family education classes, or going to family therapy. Therapy can help you and others better understand your anxiety. How to recognize changes in your anxiety Everyone responds differently to treatment for anxiety. Recovery from anxiety happens when symptoms lessen and stop interfering with your daily life at home or work. This may mean that you will start to: Have better concentration and focus. Worry will interfere less in your daily thinking. Sleep better. Be less irritable. Have more energy. Have improved memory. Try to recognize when your condition is getting worse. Contact your provider if your symptoms interfere with home or work and you feel like your condition is not improving. Follow these instructions at home: Activity Exercise. Adults should: Exercise for at least 150 minutes each week. The exercise should increase your heart rate and make you sweat (moderate-intensity exercise). Do strengthening exercises at least twice a week. Get the right amount and quality of sleep. Most adults need 7-9 hours of sleep each night. Lifestyle  Eat a healthy diet that includes plenty of vegetables, fruits, whole grains, low-fat dairy products, and lean protein. Do not eat a lot of foods that are high in fats, added sugars, or salt  (sodium). Make choices that simplify your life. Do not use any products that contain nicotine or tobacco. These products include cigarettes, chewing tobacco, and vaping devices, such as e-cigarettes. If you need help quitting, ask your provider. Avoid caffeine, alcohol, and certain over-the-counter cold medicines. These may make you feel worse. Ask your pharmacist which medicines to avoid. General instructions Take over-the-counter and prescription medicines only as told by your provider. Keep all follow-up visits. This is to make sure you are managing your anxiety well or if you need more support. Where to find support You can get help and support from: Self-help groups. Online and [employer]. A trusted spiritual leader. Couples counseling. Family education classes. Family therapy. Where to find more information You may find that joining a support group helps you deal with your anxiety. The following sources can help you find counselors or support groups near you: Mental Health America: mentalhealthamerica.net Anxiety and Depression Association of [place] (ADAA): adaa.org The First American on Mental Illness (NAMI): nami.org Contact a health care provider if: You have a hard time staying focused or finishing tasks. You spend many hours a day feeling worried about everyday life. You are very tired because you cannot stop worrying. You start to have headaches or often feel tense. You have chronic nausea or diarrhea. Get help right away if: Your heart feels like it is racing. You have shortness of breath. You have thoughts of hurting yourself or others. Get help  right away if you feel like you may hurt yourself or others, or have thoughts about taking your own life. Go to your nearest emergency room or: Call 911. Call the National Suicide Prevention Lifeline at 765-482-1593 or 988. This is open 24 hours a day. Text the Crisis Text Line at 504 124 9896. This information is not  intended to replace advice given to you by your health care provider. Make sure you discuss any questions you have with your health care provider. Document Revised: 06/22/2022 Document Reviewed: 01/04/2021 Elsevier Patient Education  2024 [employer].

## 2024-07-06 ENCOUNTER — Ambulatory Visit: Admitting: Nurse Practitioner

## 2024-07-08 NOTE — Patient Instructions (Signed)
 Managing Depression, Adult Depression is a mental health condition that affects your thoughts, feelings, and actions. Being diagnosed with depression can bring you relief if you did not know why you have felt or behaved a certain way. It could also leave you feeling overwhelmed. Finding ways to manage your symptoms can help you feel more positive about your future. How to manage lifestyle changes Being depressed is difficult. Depression can increase the level of everyday stress. Stress can make depression symptoms worse. You may believe your symptoms cannot be managed or will never improve. However, there are many things you can try to help manage your symptoms. There is hope. Managing stress  Stress is your body's reaction to life changes and events, both good and bad. Stress can add to your feelings of depression. Learning to manage your stress can help lessen your feelings of depression. Try some of the following approaches to reducing your stress (stress reduction techniques): Listen to music that you enjoy and that inspires you. Try using a meditation app or take a meditation class. Develop a practice that helps you connect with your spiritual self. Walk in nature, pray, or go to a place of worship. Practice deep breathing. To do this, inhale slowly through your nose. Pause at the top of your inhale for a few seconds and then exhale slowly, letting yourself relax. Repeat this three or four times. Practice yoga to help relax and work your muscles. Choose a stress reduction technique that works for you. These techniques take time and practice to develop. Set aside 5-15 minutes a day to do them. Therapists can offer training in these techniques. Do these things to help manage stress: Keep a journal. Know your limits. Set healthy boundaries for yourself and others, such as saying "no" when you think something is too much. Pay attention to how you react to certain situations. You may not be able to  control everything, but you can change your reaction. Add humor to your life by watching funny movies or shows. Make time for activities that you enjoy and that relax you. Spend less time using electronics, especially at night before bed. The light from screens can make your brain think it is time to get up rather than go to bed.  Medicines Medicines, such as antidepressants, are often a part of treatment for depression. Talk with your pharmacist or health care provider about all the medicines, supplements, and herbal products that you take, their possible side effects, and what medicines and other products are safe to take together. Make sure to report any side effects you may have to your health care provider. Relationships Your health care provider may suggest family therapy, couples therapy, or individual therapy as part of your treatment. How to recognize changes Everyone responds differently to treatment for depression. As you recover from depression, you may start to: Have more interest in doing activities. Feel more hopeful. Have more energy. Eat a more regular amount of food. Have better mental focus. It is important to recognize if your depression is not getting better or is getting worse. The symptoms you had in the beginning may return, such as: Feeling tired. Eating too much or too little. Sleeping too much or too little. Feeling restless, agitated, or hopeless. Trouble focusing or making decisions. Having unexplained aches and pains. Feeling irritable, angry, or aggressive. If you or your family members notice these symptoms coming back, let your health care provider know right away. Follow these instructions at home: Activity Try to  get some form of exercise each day, such as walking. Try yoga, mindfulness, or other stress reduction techniques. Participate in group activities if you are able. Lifestyle Get enough sleep. Cut down on or stop using caffeine, tobacco,  alcohol, and any other harmful substances. Eat a healthy diet that includes plenty of vegetables, fruits, whole grains, low-fat dairy products, and lean protein. Limit foods that are high in solid fats, added sugar, or salt (sodium). General instructions Take over-the-counter and prescription medicines only as told by your health care provider. Keep all follow-up visits. It is important for your health care provider to check on your mood, behavior, and medicines. Your health care provider may need to make changes to your treatment. Where to find support Talking to others  Friends and family members can be sources of support and guidance. Talk to trusted friends or family members about your condition. Explain your symptoms and let them know that you are working with a health care provider to treat your depression. Tell friends and family how they can help. Finances Find mental health providers that fit with your financial situation. Talk with your health care provider if you are worried about access to food, housing, or medicine. Call your insurance company to learn about your co-pays and prescription plan. Where to find more information You can find support in your area from: Anxiety and Depression Association of America (ADAA): adaa.org Mental Health America: mentalhealthamerica.net The First American on Mental Illness: nami.org Contact a health care provider if: You stop taking your antidepressant medicines, and you have any of these symptoms: Nausea. Headache. Light-headedness. Chills and body aches. Not being able to sleep (insomnia). You or your friends and family think your depression is getting worse. Get help right away if: You have thoughts of hurting yourself or others. Get help right away if you feel like you may hurt yourself or others, or have thoughts about taking your own life. Go to your nearest emergency room or: Call 911. Call the National Suicide Prevention Lifeline at  (215) 435-0408 or 988. This is open 24 hours a day. Text the Crisis Text Line at 318 581 7774. This information is not intended to replace advice given to you by your health care provider. Make sure you discuss any questions you have with your health care provider. Document Revised: 01/19/2022 Document Reviewed: 01/19/2022 Elsevier Patient Education  2024 [employer].

## 2024-07-13 ENCOUNTER — Encounter: Payer: Self-pay | Admitting: Nurse Practitioner

## 2024-07-13 ENCOUNTER — Ambulatory Visit (INDEPENDENT_AMBULATORY_CARE_PROVIDER_SITE_OTHER): Admitting: Nurse Practitioner

## 2024-07-13 VITALS — BP 128/81 | HR 80 | Temp 98.1°F | Ht 65.8 in | Wt 121.2 lb

## 2024-07-13 DIAGNOSIS — F325 Major depressive disorder, single episode, in full remission: Secondary | ICD-10-CM

## 2024-07-13 DIAGNOSIS — L600 Ingrowing nail: Secondary | ICD-10-CM

## 2024-07-13 NOTE — Progress Notes (Signed)
 BP 128/81   Pulse 80   Temp 98.1 F (36.7 C) (Oral)   Ht 5' 5.8 (1.671 m)   Wt 121 lb 3.2 oz (55 kg)   SpO2 98%   BMI 19.68 kg/m    Subjective:    Patient ID: Hannah Hurst, female    DOB: 18-Jun-1961, 63 y.o.   MRN: [id number]  HPI: Hannah Hurst is a 63 y.o. female  Chief Complaint  Patient presents with   Anxiety   Depression   ANXIETY/STRESS Increased Duloxetine  to 30 MG on 05/24/24 for Lupus and mood. Cannot tell difference at this time due to her son's health issues.  Has done therapy time in the past and may go back to it. Duration:stable Anxious mood: yes  Excessive worrying: yes Irritability: occasional  Sweating: no Nausea: no Palpitations:no Hyperventilation: no Panic attacks: no Agoraphobia: no  Obscessions/compulsions: no Depressed mood: baseline    07/16/24   10:59 AM 05/24/2024    9:26 AM 02/14/2023    1:24 PM 09/07/2022    8:59 AM 06/14/2022    9:01 AM  Depression screen PHQ 2/9  Decreased Interest 1 1 0 0 0  Down, Depressed, Hopeless 1 1 0 0 0  PHQ - 2 Score 2 2 0 0 0  Altered sleeping 2 1     Tired, decreased energy 2 3     Change in appetite 2 1     Feeling bad or failure about yourself  1 2     Trouble concentrating 1 2     Moving slowly or fidgety/restless 1 1     Suicidal thoughts 0 0     PHQ-9 Score 11 12     Difficult doing work/chores Somewhat difficult Somewhat difficult     Anhedonia: no Weight changes: no Insomnia: yes hard to fall asleep -- her dog is sick, situational Hypersomnia: no Fatigue/loss of energy: no Feelings of worthlessness: no Feelings of guilt: no Impaired concentration/indecisiveness: no Suicidal ideations: no  Crying spells: no Recent Stressors/Life Changes: yes   Relationship problems: no   Family stress: yes     Financial stress: no    Job stress: no    Recent death/loss: no     07/16/2024   11:00 AM 05/24/2024    9:26 AM  GAD 7 : Generalized Anxiety Score  Nervous, Anxious, on Edge 3 2   Control/stop worrying 3 2  Worry too much - different things 3 2  Trouble relaxing 1 1  Restless 2 1  Easily annoyed or irritable 1 1  Afraid - awful might happen 2 3  Total GAD 7 Score 15 12  Anxiety Difficulty Somewhat difficult Somewhat difficult    Relevant past medical, surgical, family and social history reviewed and updated as indicated. Interim medical history since our last visit reviewed. Allergies and medications reviewed and updated.  Review of Systems  Constitutional:  Negative for activity change, appetite change, diaphoresis, fatigue and fever.  Respiratory:  Negative for cough, chest tightness, shortness of breath and wheezing.   Cardiovascular:  Negative for chest pain, palpitations and leg swelling.  Gastrointestinal: Negative.   Neurological: Negative.   Psychiatric/Behavioral:  Positive for sleep disturbance. Negative for decreased concentration, self-injury and suicidal ideas. The patient is nervous/anxious.    Per HPI unless specifically indicated above     Objective:    BP 128/81   Pulse 80   Temp 98.1 F (36.7 C) (Oral)   Ht 5' 5.8 (1.671 m)  Wt 121 lb 3.2 oz (55 kg)   SpO2 98%   BMI 19.68 kg/m   Wt Readings from Last 3 Encounters:  07/13/24 121 lb 3.2 oz (55 kg)  05/24/24 120 lb 12.8 oz (54.8 kg)  12/15/23 136 lb 12.8 oz (62.1 kg)    Physical Exam Vitals and nursing note reviewed.  Constitutional:      General: She is awake. She is not in acute distress.    Appearance: She is well-developed and well-groomed. She is not ill-appearing or toxic-appearing.  HENT:     Head: Normocephalic.     Right Ear: Hearing and external ear normal.     Left Ear: Hearing and external ear normal.  Eyes:     General: Lids are normal.        Right eye: No discharge.        Left eye: No discharge.     Conjunctiva/sclera: Conjunctivae normal.     Pupils: Pupils are equal, round, and reactive to light.  Neck:     Thyroid : No thyromegaly.     Vascular: No  carotid bruit.  Cardiovascular:     Rate and Rhythm: Normal rate and regular rhythm.     Pulses:          Dorsalis pedis pulses are 2+ on the right side and 2+ on the left side.       Posterior tibial pulses are 2+ on the right side and 2+ on the left side.     Heart sounds: Normal heart sounds. No murmur heard.    No gallop.  Pulmonary:     Effort: Pulmonary effort is normal. No accessory muscle usage or respiratory distress.     Breath sounds: Normal breath sounds.  Abdominal:     General: Bowel sounds are normal. There is no distension.     Palpations: Abdomen is soft.     Tenderness: There is no abdominal tenderness.  Musculoskeletal:     Cervical back: Normal range of motion and neck supple.     Right lower leg: No edema.     Left lower leg: No edema.     Right foot: Normal range of motion.     Left foot: Normal range of motion.  Feet:     Right foot:     Protective Sensation: 10 sites tested.  10 sites sensed.     Left foot:     Protective Sensation: 10 sites tested.  10 sites sensed.     Toenail Condition: Left toenails are ingrown.     Comments: Great toe left foot starting to grow into skin. Small callus area to bottom of left foot. Lymphadenopathy:     Cervical: No cervical adenopathy.  Skin:    General: Skin is warm and dry.  Neurological:     Mental Status: She is alert and oriented to person, place, and time.     Deep Tendon Reflexes: Reflexes are normal and symmetric.     Reflex Scores:      Brachioradialis reflexes are 2+ on the right side and 2+ on the left side.      Patellar reflexes are 2+ on the right side and 2+ on the left side. Psychiatric:        Attention and Perception: Attention normal.        Mood and Affect: Mood normal.        Speech: Speech normal.        Behavior: Behavior normal. Behavior is cooperative.  Thought Content: Thought content normal.    Results for orders placed or performed in visit on 05/24/24  CBC with  Differential/Platelet   Collection Time: 05/24/24 10:16 AM  Result Value Ref Range   WBC 4.8 3.4 - 10.8 x10E3/uL   RBC 4.57 3.77 - 5.28 x10E6/uL   Hemoglobin 14.4 11.1 - 15.9 g/dL   Hematocrit 56.0 65.9 - 46.6 %   MCV 96 79 - 97 fL   MCH 31.5 26.6 - 33.0 pg   MCHC 32.8 31.5 - 35.7 g/dL   RDW 87.0 88.2 - 84.5 %   Platelets 264 150 - 450 x10E3/uL   Neutrophils 69 Not Estab. %   Lymphs 15 Not Estab. %   Monocytes 14 Not Estab. %   Eos 1 Not Estab. %   Basos 1 Not Estab. %   Neutrophils Absolute 3.3 1.4 - 7.0 x10E3/uL   Lymphocytes Absolute 0.7 0.7 - 3.1 x10E3/uL   Monocytes Absolute 0.7 0.1 - 0.9 x10E3/uL   EOS (ABSOLUTE) 0.0 0.0 - 0.4 x10E3/uL   Basophils Absolute 0.0 0.0 - 0.2 x10E3/uL   Immature Granulocytes 0 Not Estab. %   Immature Grans (Abs) 0.0 0.0 - 0.1 x10E3/uL  Comprehensive metabolic panel with GFR   Collection Time: 05/24/24 10:16 AM  Result Value Ref Range   Glucose 94 70 - 99 mg/dL   BUN 18 8 - 27 mg/dL   Creatinine, Ser 9.35 0.57 - 1.00 mg/dL   eGFR 899 >40 fO/fpw/8.26   BUN/Creatinine Ratio 28 12 - 28   Sodium 138 134 - 144 mmol/L   Potassium 4.1 3.5 - 5.2 mmol/L   Chloride 99 96 - 106 mmol/L   CO2 23 20 - 29 mmol/L   Calcium 9.7 8.7 - 10.3 mg/dL   Total Protein 6.7 6.0 - 8.5 g/dL   Albumin 4.8 3.9 - 4.9 g/dL   Globulin, Total 1.9 1.5 - 4.5 g/dL   Bilirubin Total 0.4 0.0 - 1.2 mg/dL   Alkaline Phosphatase 88 44 - 121 IU/L   AST 22 0 - 40 IU/L   ALT 16 0 - 32 IU/L  Lipid Panel w/o Chol/HDL Ratio   Collection Time: 05/24/24 10:16 AM  Result Value Ref Range   Cholesterol, Total 239 (H) 100 - 199 mg/dL   Triglycerides 45 0 - 149 mg/dL   HDL 885 >60 mg/dL   VLDL Cholesterol Cal 7 5 - 40 mg/dL   LDL Chol Calc (NIH) 881 (H) 0 - 99 mg/dL  TSH   Collection Time: 05/24/24 10:16 AM  Result Value Ref Range   TSH 1.190 0.450 - 4.500 uIU/mL  VITAMIN D  25 Hydroxy (Vit-D Deficiency, Fractures)   Collection Time: 05/24/24 10:16 AM  Result Value Ref Range   Vit  D, 25-Hydroxy 121.0 (H) 30.0 - 100.0 ng/mL  Vitamin B12   Collection Time: 05/24/24 10:16 AM  Result Value Ref Range   Vitamin B-12 573 232 - 1,245 pg/mL  T4, free   Collection Time: 05/24/24 10:16 AM  Result Value Ref Range   Free T4 1.06 0.82 - 1.77 ng/dL  Thyroid  peroxidase antibody   Collection Time: 05/24/24 10:16 AM  Result Value Ref Range   Thyroperoxidase Ab SerPl-aCnc <9 0 - 34 IU/mL  HgB A1c   Collection Time: 05/24/24 10:16 AM  Result Value Ref Range   Hgb A1c MFr Bld 4.9 4.8 - 5.6 %   Est. average glucose Bld gHb Est-mCnc 94 mg/dL  Hepatitis C antibody   Collection Time: 05/24/24 10:16  AM  Result Value Ref Range   Hep C Virus Ab Non Reactive Non Reactive  HIV Antibody (routine testing w rflx)   Collection Time: 05/24/24 10:16 AM  Result Value Ref Range   HIV Screen 4th Generation wRfx Non Reactive Non Reactive      Assessment & Plan:   Problem List Items Addressed This Visit       Other   Depression, major, in remission - Primary   Chronic, ongoing. Denies SI/HI.  Will maintain Duloxetine  at 30 MG for now, much of her current anxiety/depression is situational and related to her som's health.  She is going to look into virtual therapy.  Educated her on this.  Further adjust as needed.        Other Visit Diagnoses       Ingrown left greater toenail       Referral to podiatry.   Relevant Orders   Ambulatory referral to Podiatry        Follow up plan: Return in about 3 months (around 10/13/2024) for Depression, ANXIETY + Lupus.

## 2024-07-13 NOTE — Assessment & Plan Note (Signed)
 Chronic, ongoing. Denies SI/HI.  Will maintain Duloxetine  at 30 MG for now, much of her current anxiety/depression is situational and related to her som's health.  She is going to look into virtual therapy.  Educated her on this.  Further adjust as needed.

## 2024-07-23 ENCOUNTER — Other Ambulatory Visit: Payer: Self-pay | Admitting: Medical Genetics

## 2024-07-23 ENCOUNTER — Ambulatory Visit: Payer: Self-pay | Admitting: Podiatry

## 2024-07-23 DIAGNOSIS — Z006 Encounter for examination for normal comparison and control in clinical research program: Secondary | ICD-10-CM

## 2024-08-01 ENCOUNTER — Encounter: Payer: Self-pay | Admitting: Podiatry

## 2024-08-01 ENCOUNTER — Ambulatory Visit (INDEPENDENT_AMBULATORY_CARE_PROVIDER_SITE_OTHER): Payer: Self-pay | Admitting: Podiatry

## 2024-08-01 VITALS — Ht 65.8 in | Wt 121.0 lb

## 2024-08-01 DIAGNOSIS — L600 Ingrowing nail: Secondary | ICD-10-CM

## 2024-08-01 DIAGNOSIS — B070 Plantar wart: Secondary | ICD-10-CM

## 2024-08-01 NOTE — Progress Notes (Signed)
  Subjective:  Patient ID: Hannah Hurst, female    DOB: 1961-05-11,  MRN: [id number]  Chief Complaint  Patient presents with   Ingrown Toenail    RM 6 Patient is here for possible wart on left foot and evaluation of the right 4th and left 1st toe nails for ingrown nails.    64 y.o. female presents with the above complaint. History confirmed with patient.   Objective:  Physical Exam: warm, good capillary refill, no trophic changes or ulcerative lesions, normal DP and PT pulses, normal sensory exam, and no active ingrown nail or paronychia submetatarsal 3 left foot there is a small verruca plantaris  Assessment:   1. Verruca plantaris   2. Ingrown nail      Plan:  Patient was evaluated and treated and all questions answered.   Currently ingrown's are not symptomatic and there is no sign of infection.  I discussed with her that if this worsens that partial great matricectomy can offer some relief.  For regular nail care can continue with pedicures as needed.  She is a small verruca plantaris submetatarsal 3 of the left foot which we discussed etiology and treatment options of.  I recommended debridement and application of salicylic acid to destroy the lesion today.  This was applied.  Follow-up as needed for this should alleviate soon and she will utilize salicylic acid at home as well.  Return if symptoms worsen or fail to improve.

## 2024-10-13 NOTE — Patient Instructions (Incomplete)
 Be Involved in Caring For Your Health:  Taking Medications When medications are taken as directed, they can greatly improve your health. But if they are not taken as prescribed, they may not work. In some cases, not taking them correctly can be harmful. To help ensure your treatment remains effective and safe, understand your medications and how to take them. Bring your medications to each visit for review by your provider.  Your lab results, notes, and after visit summary will be available on My Chart. We strongly encourage you to use this feature. If lab results are abnormal the clinic will contact you with the appropriate steps. If the clinic does not contact you assume the results are satisfactory. You can always view your results on My Chart. If you have questions regarding your health or results, please contact the clinic during office hours. You can also ask questions on My Chart.  We at [facility] are grateful that you chose us  to provide your care. We strive to provide evidence-based and compassionate care and are always looking for feedback. If you get a survey from the clinic please complete this so we can hear your opinions.  Managing Depression, Adult Depression is a mental health condition that affects your thoughts, feelings, and actions. Being diagnosed with depression can bring you relief if you did not know why you have felt or behaved a certain way. It could also leave you feeling overwhelmed. Finding ways to manage your symptoms can help you feel more positive about your future. How to manage lifestyle changes Being depressed is difficult. Depression can increase the level of everyday stress. Stress can make depression symptoms worse. You may believe your symptoms cannot be managed or will never improve. However, there are many things you can try to help manage your symptoms. There is hope. Managing stress  Stress is your body's reaction to life changes and events,  both good and bad. Stress can add to your feelings of depression. Learning to manage your stress can help lessen your feelings of depression. Try some of the following approaches to reducing your stress (stress reduction techniques): Listen to music that you enjoy and that inspires you. Try using a meditation app or take a meditation class. Develop a practice that helps you connect with your spiritual self. Walk in nature, pray, or go to a place of worship. Practice deep breathing. To do this, inhale slowly through your nose. Pause at the top of your inhale for a few seconds and then exhale slowly, letting yourself relax. Repeat this three or four times. Practice yoga to help relax and work your muscles. Choose a stress reduction technique that works for you. These techniques take time and practice to develop. Set aside 5-15 minutes a day to do them. Therapists can offer training in these techniques. Do these things to help manage stress: Keep a journal. Know your limits. Set healthy boundaries for yourself and others, such as saying no when you think something is too much. Pay attention to how you react to certain situations. You may not be able to control everything, but you can change your reaction. Add humor to your life by watching funny movies or shows. Make time for activities that you enjoy and that relax you. Spend less time using electronics, especially at night before bed. The light from screens can make your brain think it is time to get up rather than go to bed.  Medicines Medicines, such as antidepressants, are often a part of  treatment for depression. Talk with your pharmacist or health care provider about all the medicines, supplements, and herbal products that you take, their possible side effects, and what medicines and other products are safe to take together. Make sure to report any side effects you may have to your health care provider. Relationships Your health care  provider may suggest family therapy, couples therapy, or individual therapy as part of your treatment. How to recognize changes Everyone responds differently to treatment for depression. As you recover from depression, you may start to: Have more interest in doing activities. Feel more hopeful. Have more energy. Eat a more regular amount of food. Have better mental focus. It is important to recognize if your depression is not getting better or is getting worse. The symptoms you had in the beginning may return, such as: Feeling tired. Eating too much or too little. Sleeping too much or too little. Feeling restless, agitated, or hopeless. Trouble focusing or making decisions. Having unexplained aches and pains. Feeling irritable, angry, or aggressive. If you or your family members notice these symptoms coming back, let your health care provider know right away. Follow these instructions at home: Activity Try to get some form of exercise each day, such as walking. Try yoga, mindfulness, or other stress reduction techniques. Participate in group activities if you are able. Lifestyle Get enough sleep. Cut down on or stop using caffeine, tobacco, alcohol, and any other harmful substances. Eat a healthy diet that includes plenty of vegetables, fruits, whole grains, low-fat dairy products, and lean protein. Limit foods that are high in solid fats, added sugar, or salt (sodium). General instructions Take over-the-counter and prescription medicines only as told by your health care provider. Keep all follow-up visits. It is important for your health care provider to check on your mood, behavior, and medicines. Your health care provider may need to make changes to your treatment. Where to find support Talking to others  Friends and family members can be sources of support and guidance. Talk to trusted friends or family members about your condition. Explain your symptoms and let them know that you  are working with a health care provider to treat your depression. Tell friends and family how they can help. Finances Find mental health providers that fit with your financial situation. Talk with your health care provider if you are worried about access to food, housing, or medicine. Call your insurance company to learn about your co-pays and prescription plan. Where to find more information You can find support in your area from: Anxiety and Depression Association of America (ADAA): adaa.org Mental Health [place]: mentalhealthamerica.net [employer] on Mental Illness: nami.org Contact a health care provider if: You stop taking your antidepressant medicines, and you have any of these symptoms: Nausea. Headache. Light-headedness. Chills and body aches. Not being able to sleep (insomnia). You or your friends and family think your depression is getting worse. Get help right away if: You have thoughts of hurting yourself or others. Get help right away if you feel like you may hurt yourself or others, or have thoughts about taking your own life. Go to your nearest emergency room or: Call 911. Call the National Suicide Prevention Lifeline at (743) 630-7432 or 988. This is open 24 hours a day. Text the Crisis Text Line at 854-590-3828. This information is not intended to replace advice given to you by your health care provider. Make sure you discuss any questions you have with your health care provider. Document Revised: 01/19/2022 Document Reviewed:  01/19/2022 Elsevier Patient Education  2024 [employer].

## 2024-10-16 ENCOUNTER — Ambulatory Visit: Admitting: Nurse Practitioner
